# Patient Record
Sex: Female | Born: 1965 | Race: White | Hispanic: No | Marital: Married | Smoking: Never smoker
Health system: Southern US, Community
[De-identification: ages and names within clinical notes are randomized; demographics above are authoritative.]

## PROBLEM LIST (undated history)

## (undated) ENCOUNTER — Emergency Department (HOSPITAL_BASED_OUTPATIENT_CLINIC_OR_DEPARTMENT_OTHER): Admission: EM | Source: Home / Self Care

## (undated) DIAGNOSIS — M1990 Unspecified osteoarthritis, unspecified site: Secondary | ICD-10-CM

## (undated) DIAGNOSIS — K6289 Other specified diseases of anus and rectum: Secondary | ICD-10-CM

## (undated) DIAGNOSIS — G43909 Migraine, unspecified, not intractable, without status migrainosus: Secondary | ICD-10-CM

## (undated) HISTORY — PX: HEMORRHOID SURGERY: SHX153

## (undated) HISTORY — DX: Migraine, unspecified, not intractable, without status migrainosus: G43.909

## (undated) HISTORY — PX: NASAL RECONSTRUCTION: SHX2069

## (undated) HISTORY — DX: Other specified diseases of anus and rectum: K62.89

## (undated) HISTORY — DX: Unspecified osteoarthritis, unspecified site: M19.90

---

## 2000-03-10 ENCOUNTER — Encounter (INDEPENDENT_AMBULATORY_CARE_PROVIDER_SITE_OTHER): Payer: Self-pay

## 2000-03-10 ENCOUNTER — Ambulatory Visit (HOSPITAL_COMMUNITY): Admission: RE | Admit: 2000-03-10 | Discharge: 2000-03-10 | Payer: Self-pay | Admitting: Gastroenterology

## 2000-06-17 ENCOUNTER — Other Ambulatory Visit: Admission: RE | Admit: 2000-06-17 | Discharge: 2000-06-17 | Payer: Self-pay | Admitting: Obstetrics and Gynecology

## 2000-07-06 ENCOUNTER — Encounter (INDEPENDENT_AMBULATORY_CARE_PROVIDER_SITE_OTHER): Payer: Self-pay

## 2000-07-06 ENCOUNTER — Other Ambulatory Visit: Admission: RE | Admit: 2000-07-06 | Discharge: 2000-07-06 | Payer: Self-pay | Admitting: Obstetrics and Gynecology

## 2001-06-21 ENCOUNTER — Other Ambulatory Visit: Admission: RE | Admit: 2001-06-21 | Discharge: 2001-06-21 | Payer: Self-pay | Admitting: Obstetrics and Gynecology

## 2001-07-30 ENCOUNTER — Other Ambulatory Visit: Admission: RE | Admit: 2001-07-30 | Discharge: 2001-07-30 | Payer: Self-pay | Admitting: Gastroenterology

## 2001-07-30 ENCOUNTER — Encounter (INDEPENDENT_AMBULATORY_CARE_PROVIDER_SITE_OTHER): Payer: Self-pay | Admitting: Specialist

## 2002-12-15 ENCOUNTER — Other Ambulatory Visit: Admission: RE | Admit: 2002-12-15 | Discharge: 2002-12-15 | Payer: Self-pay | Admitting: Obstetrics and Gynecology

## 2003-05-29 ENCOUNTER — Ambulatory Visit (HOSPITAL_COMMUNITY): Admission: RE | Admit: 2003-05-29 | Discharge: 2003-05-29 | Payer: Self-pay | Admitting: Gastroenterology

## 2003-05-29 ENCOUNTER — Encounter (INDEPENDENT_AMBULATORY_CARE_PROVIDER_SITE_OTHER): Payer: Self-pay | Admitting: Specialist

## 2003-07-17 ENCOUNTER — Ambulatory Visit (HOSPITAL_COMMUNITY): Admission: RE | Admit: 2003-07-17 | Discharge: 2003-07-17 | Payer: Self-pay | Admitting: Gastroenterology

## 2003-07-17 ENCOUNTER — Encounter (INDEPENDENT_AMBULATORY_CARE_PROVIDER_SITE_OTHER): Payer: Self-pay

## 2004-02-08 ENCOUNTER — Encounter: Admission: RE | Admit: 2004-02-08 | Discharge: 2004-02-08 | Payer: Self-pay | Admitting: Otolaryngology

## 2004-11-26 ENCOUNTER — Ambulatory Visit: Payer: Self-pay | Admitting: Gastroenterology

## 2005-03-21 ENCOUNTER — Emergency Department (HOSPITAL_COMMUNITY): Admission: EM | Admit: 2005-03-21 | Discharge: 2005-03-21 | Payer: Self-pay | Admitting: Emergency Medicine

## 2005-03-26 ENCOUNTER — Ambulatory Visit (HOSPITAL_COMMUNITY): Admission: RE | Admit: 2005-03-26 | Discharge: 2005-03-26 | Payer: Self-pay | Admitting: Emergency Medicine

## 2005-05-21 ENCOUNTER — Ambulatory Visit: Payer: Self-pay | Admitting: Gastroenterology

## 2005-05-27 ENCOUNTER — Ambulatory Visit (HOSPITAL_COMMUNITY): Admission: RE | Admit: 2005-05-27 | Discharge: 2005-05-27 | Payer: Self-pay | Admitting: Gastroenterology

## 2005-06-03 ENCOUNTER — Ambulatory Visit: Payer: Self-pay | Admitting: Gastroenterology

## 2005-12-30 ENCOUNTER — Other Ambulatory Visit: Admission: RE | Admit: 2005-12-30 | Discharge: 2005-12-30 | Payer: Self-pay | Admitting: Obstetrics and Gynecology

## 2006-06-05 ENCOUNTER — Emergency Department (HOSPITAL_COMMUNITY): Admission: EM | Admit: 2006-06-05 | Discharge: 2006-06-05 | Payer: Self-pay | Admitting: Emergency Medicine

## 2007-09-06 ENCOUNTER — Encounter: Admission: RE | Admit: 2007-09-06 | Discharge: 2007-09-06 | Payer: Self-pay | Admitting: Gynecology

## 2008-05-25 ENCOUNTER — Other Ambulatory Visit: Admission: RE | Admit: 2008-05-25 | Discharge: 2008-05-25 | Payer: Self-pay | Admitting: Gynecology

## 2008-08-30 ENCOUNTER — Ambulatory Visit (HOSPITAL_BASED_OUTPATIENT_CLINIC_OR_DEPARTMENT_OTHER): Admission: RE | Admit: 2008-08-30 | Discharge: 2008-08-30 | Payer: Self-pay | Admitting: Urology

## 2008-08-30 ENCOUNTER — Encounter (INDEPENDENT_AMBULATORY_CARE_PROVIDER_SITE_OTHER): Payer: Self-pay | Admitting: Urology

## 2008-12-14 ENCOUNTER — Ambulatory Visit: Payer: Self-pay | Admitting: Women's Health

## 2009-04-19 ENCOUNTER — Ambulatory Visit: Payer: Self-pay | Admitting: Women's Health

## 2009-07-03 ENCOUNTER — Other Ambulatory Visit: Admission: RE | Admit: 2009-07-03 | Discharge: 2009-07-03 | Payer: Self-pay | Admitting: Gynecology

## 2009-07-03 ENCOUNTER — Encounter: Payer: Self-pay | Admitting: Gynecology

## 2009-07-03 ENCOUNTER — Ambulatory Visit: Payer: Self-pay | Admitting: Gynecology

## 2009-07-23 ENCOUNTER — Ambulatory Visit: Payer: Self-pay | Admitting: Gynecology

## 2009-11-07 HISTORY — PX: ABDOMINAL HYSTERECTOMY: SHX81

## 2009-11-12 ENCOUNTER — Ambulatory Visit (HOSPITAL_COMMUNITY): Admission: RE | Admit: 2009-11-12 | Discharge: 2009-11-13 | Payer: Self-pay | Admitting: Obstetrics and Gynecology

## 2009-12-28 ENCOUNTER — Emergency Department (HOSPITAL_BASED_OUTPATIENT_CLINIC_OR_DEPARTMENT_OTHER): Admission: EM | Admit: 2009-12-28 | Discharge: 2009-12-28 | Payer: Self-pay | Admitting: Emergency Medicine

## 2010-03-14 ENCOUNTER — Other Ambulatory Visit: Admission: RE | Admit: 2010-03-14 | Discharge: 2010-03-14 | Payer: Self-pay | Admitting: Radiology

## 2010-12-29 ENCOUNTER — Encounter: Payer: Self-pay | Admitting: Gastroenterology

## 2011-02-17 ENCOUNTER — Ambulatory Visit: Payer: PRIVATE HEALTH INSURANCE | Attending: Sports Medicine

## 2011-02-17 DIAGNOSIS — M542 Cervicalgia: Secondary | ICD-10-CM | POA: Insufficient documentation

## 2011-02-17 DIAGNOSIS — M2569 Stiffness of other specified joint, not elsewhere classified: Secondary | ICD-10-CM | POA: Insufficient documentation

## 2011-02-17 DIAGNOSIS — IMO0001 Reserved for inherently not codable concepts without codable children: Secondary | ICD-10-CM | POA: Insufficient documentation

## 2011-02-21 ENCOUNTER — Ambulatory Visit: Payer: PRIVATE HEALTH INSURANCE

## 2011-02-24 ENCOUNTER — Ambulatory Visit: Payer: PRIVATE HEALTH INSURANCE | Admitting: Physical Therapy

## 2011-02-26 ENCOUNTER — Ambulatory Visit: Payer: PRIVATE HEALTH INSURANCE

## 2011-03-03 ENCOUNTER — Ambulatory Visit: Payer: PRIVATE HEALTH INSURANCE

## 2011-03-06 ENCOUNTER — Ambulatory Visit: Payer: PRIVATE HEALTH INSURANCE

## 2011-03-10 ENCOUNTER — Ambulatory Visit: Payer: PRIVATE HEALTH INSURANCE | Attending: Sports Medicine

## 2011-03-10 DIAGNOSIS — IMO0001 Reserved for inherently not codable concepts without codable children: Secondary | ICD-10-CM | POA: Insufficient documentation

## 2011-03-10 DIAGNOSIS — M2569 Stiffness of other specified joint, not elsewhere classified: Secondary | ICD-10-CM | POA: Insufficient documentation

## 2011-03-10 DIAGNOSIS — M542 Cervicalgia: Secondary | ICD-10-CM | POA: Insufficient documentation

## 2011-03-11 LAB — CBC
HCT: 30.3 % — ABNORMAL LOW (ref 36.0–46.0)
HCT: 40.6 % (ref 36.0–46.0)
Hemoglobin: 10.3 g/dL — ABNORMAL LOW (ref 12.0–15.0)
Hemoglobin: 13.6 g/dL (ref 12.0–15.0)
MCHC: 33.4 g/dL (ref 30.0–36.0)
MCHC: 34 g/dL (ref 30.0–36.0)
MCV: 92.8 fL (ref 78.0–100.0)
MCV: 93.4 fL (ref 78.0–100.0)
Platelets: 202 10*3/uL (ref 150–400)
Platelets: 246 10*3/uL (ref 150–400)
RBC: 3.25 MIL/uL — ABNORMAL LOW (ref 3.87–5.11)
RBC: 4.37 MIL/uL (ref 3.87–5.11)
RDW: 13.6 % (ref 11.5–15.5)
RDW: 13.8 % (ref 11.5–15.5)
WBC: 13.5 10*3/uL — ABNORMAL HIGH (ref 4.0–10.5)
WBC: 7.7 10*3/uL (ref 4.0–10.5)

## 2011-03-19 ENCOUNTER — Ambulatory Visit: Payer: PRIVATE HEALTH INSURANCE

## 2011-04-03 ENCOUNTER — Encounter: Payer: PRIVATE HEALTH INSURANCE | Admitting: Physical Therapy

## 2011-04-22 NOTE — Op Note (Signed)
Megan Castillo, Megan Castillo                ACCOUNT NO.:  [id number]   MEDICAL RECORD NO.:  [id number]          PATIENT TYPE:  AMB   LOCATION:  [facility]                         FACILITY:  [facility]   PHYSICIAN:  Maretta Bees. Vonita Moss, M.D.DATE OF BIRTH:  1966-05-02   DATE OF PROCEDURE:  08/30/2008  DATE OF DISCHARGE:                               OPERATIVE REPORT   PREOPERATIVE DIAGNOSIS:  Rule out interstitial cystitis.   POSTOPERATIVE DIAGNOSIS:  Rule out interstitial cystitis.   PROCEDURE:  Cystoscopy, hydraulic overdistention of bladder and cold cup  bladder biopsy.   SURGEON:  Maretta Bees. Vonita Moss, M.D.   ANESTHESIA:  General.   INDICATIONS:  This lady has had problems of frequent urination and  nocturia and bladder pressure/pain over the last several weeks.  She had  a pelvic pain and symptom score of 21 which was concerning for the  diagnosis of interstitial cystitis.  She said she stopped drinking tea  which was her only drink for the last week and is feeling somewhat  better but still has frequency 10-12 times a day and nocturia times one  to two.   PROCEDURE IN DETAIL:  The patient was brought to the operating room and  placed in lithotomy position.  External genitalia were prepped and  draped in the usual fashion.  She was cystoscoped and the bladder was  unremarkable with no stones, tumors or inflammatory lesions.  She then  underwent bladder dilation of 36 inches of water pressure to 700 mL at  which time she was leaking around the cystoscope.  I emptied the bladder  and looked back in and there was gross hemorrhage and submucosal  petechiae in all four quadrants of the bladder.  There was no evidence  of any bladder rupture.  At this point I used the cold cup bladder  biopsy forceps and took 3 biopsies in hemorrhage areas across the  bladder base and fulgurated the biopsy sites with the Bugbee electrode.  At this point there was good hemostasis and the bladder was emptied, the  scope  was removed.  She was given a B&O suppository and also 30 mg of  Toradol IV and was taken to the recovery room in good condition having  tolerated the procedure well.      Maretta Bees. Vonita Moss, M.D.  Electronically Signed     LJP/MEDQ  D:  08/30/2008  T:  08/30/2008  Job:  [id number]   cc:   Gaetano Hawthorne. Lily Peer, M.D.  Fax: [phone]

## 2011-09-08 LAB — POCT HEMOGLOBIN-HEMACUE: Hemoglobin: 13.2

## 2011-09-30 ENCOUNTER — Other Ambulatory Visit: Payer: Self-pay | Admitting: Gastroenterology

## 2011-10-01 ENCOUNTER — Ambulatory Visit
Admission: RE | Admit: 2011-10-01 | Discharge: 2011-10-01 | Disposition: A | Discharge status: Home / Self Care | Payer: PRIVATE HEALTH INSURANCE | Source: Ambulatory Visit | Attending: Gastroenterology | Admitting: Gastroenterology

## 2011-10-01 MED ORDER — IOHEXOL 300 MG/ML  SOLN
100.0000 mL | Freq: Once | INTRAMUSCULAR | Status: AC | PRN
  Administered 2011-10-01: 100 mL via INTRAVENOUS

## 2011-11-10 ENCOUNTER — Encounter (INDEPENDENT_AMBULATORY_CARE_PROVIDER_SITE_OTHER): Payer: Self-pay | Admitting: General Surgery

## 2011-11-10 ENCOUNTER — Ambulatory Visit (INDEPENDENT_AMBULATORY_CARE_PROVIDER_SITE_OTHER): Payer: PRIVATE HEALTH INSURANCE | Admitting: General Surgery

## 2011-11-10 VITALS — BP 126/88 | HR 66 | Temp 98.4°F | Resp 16 | Ht 64.0 in | Wt 143.0 lb

## 2011-11-10 DIAGNOSIS — K645 Perianal venous thrombosis: Secondary | ICD-10-CM

## 2011-11-10 NOTE — Progress Notes (Signed)
Subjective:     Patient ID: Megan Castillo, female   DOB: [birth year], 45 y.o.   MRN: [id number]  HPIMs. Megan Castillo is seen only urgent office as a thrombosed external hemorrhoid Dr. Catha Gosselin is her regular physician but she's not seen him recently. The patient states about 2 years ago she thrombosed hemorrhoid was seen by one of our port and is here in the office hemorrhoid was M.D. Patient states that 3 weeks ago she had a colonoscopy performed by Dr. Charna Elizabeth and then over the Thanksgiving weekend when on a cruise and didn't have a bowel movement for 5 days and then started having problems with this thrombosed external hemorrhoid   Review of Systems No current outpatient prescriptions on file.   Past Surgical History  Procedure Date  . Abdominal hysterectomy 11/2009   No Known Allergies      Objective:   Physical ExamBP 126/88  Pulse 66  Temp(Src) 98.4 F (36.9 C) (Temporal)  Resp 16  Ht 5\' 4"  (1.626 m)  Wt 143 lb (64.864 kg)  BMI 24.55 kg/m2 Patient has a thrombosed hemorrhoid left posterior quadrant with a lot of anal spasm and use topical lidocaine ointment and then try and anesthetize it she is difficult to get complete anesthetic after about 5 or 6 cc of 1% with adrenaline I was able anesthetized and excised the external hemorrhoid with thrombosed and put a figure-of-eight suture of 3-0 chromic at the apex for hemostasis     Assessment:     Thrombosed external hemorrhoid history of previous thrombosed femoral. Anoscopic exam doesn't show a ternal hemorrhoids at this time and Dr. Loreta Ave did not note any significant hemorrhoids to the patient when she did recent colonoscopy    Plan:     Return prn Vicodin & 5% lidocain ointment

## 2011-11-10 NOTE — Patient Instructions (Signed)
MiraLax or other stool softeners laxatives on daily basis for the next few days. Apply lidocaine 5% ointment to the anus on p.r.n. basis and return to see [place] in about 2-3 weeks if her still having some problem. Soaking in warm tub will help of the spasm in keeping the area clean will help with rapid

## 2011-11-12 ENCOUNTER — Telehealth (INDEPENDENT_AMBULATORY_CARE_PROVIDER_SITE_OTHER): Payer: Self-pay

## 2011-11-12 NOTE — Telephone Encounter (Signed)
Pt called c/o constipation. Pt had thromb hems done Monday. Pt advised to use miralax,  and stool softners, increase fluids. If not resolving pt can use glycerin suppository. Pt to call if this does not resolve constipation.

## 2011-11-14 ENCOUNTER — Telehealth (INDEPENDENT_AMBULATORY_CARE_PROVIDER_SITE_OTHER): Payer: Self-pay

## 2011-11-14 NOTE — Telephone Encounter (Signed)
Pt calling in b/c still concerned about having some bleeding after having a throm hem excised by Dr Zachery Dakins on Monday. I explained to the pt that this was not uncommon after having this excised b/c she has stitches from the procedure. I advised pt that the bleeding still could occur up till next week b/c everytime she has a BM it will aggravate the area that was excised. I advised the pt to take Advil around the clock, use baby wet wipes to clean after BM, and to do warm sitz baths. The pt understands and will call us back if things get worse./ [facility]

## 2011-12-22 ENCOUNTER — Ambulatory Visit (INDEPENDENT_AMBULATORY_CARE_PROVIDER_SITE_OTHER): Payer: Private Health Insurance - Indemnity | Admitting: General Surgery

## 2011-12-22 ENCOUNTER — Encounter (INDEPENDENT_AMBULATORY_CARE_PROVIDER_SITE_OTHER): Payer: Self-pay | Admitting: General Surgery

## 2011-12-22 VITALS — BP 120/65 | HR 72 | Temp 98.1°F | Resp 16 | Ht 65.0 in | Wt 148.0 lb

## 2011-12-22 DIAGNOSIS — K648 Other hemorrhoids: Secondary | ICD-10-CM

## 2011-12-22 MED ORDER — PRAMOXINE HCL 1 % RE FOAM: Freq: Three times a day (TID) | RECTAL | Status: AC

## 2011-12-22 NOTE — Progress Notes (Signed)
Chief Complaint  Patient presents with  . Follow-up    HISTORY: Pt presents with hemorrhoids on and off for last 18 months.  She had external thrombosed hemorrhoid excised by Dr. Zachery Dakins in early December.  She has not had recurrent pain, but she has started to have bleeding.  She did get constipated for a bit in the last few days.  She noted the blood 2 days ago.  She denies nausea or vomiting.    Past Medical History  Diagnosis Date  . Arthritis   . Rectal pain   . Constipation   . Hemorrhoids     Past Surgical History  Procedure Date  . Abdominal hysterectomy 11/2009  . Hemorrhoid surgery     Current Outpatient Prescriptions  Medication Sig Dispense Refill  . pramoxine (PROCTOFOAM) 1 % foam Place rectally 3 (three) times daily.  15 g  3     No Known Allergies   No family history on file.   History   Social History  . Marital Status: Married    Spouse Name: N/A    Number of Children: N/A  . Years of Education: N/A   Social History Main Topics  . Smoking status: Never Smoker   . Smokeless tobacco: Never Used  . Alcohol Use: No  . Drug Use: No  . Sexually Active: None   REVIEW OF SYSTEMS - PERTINENT POSITIVES ONLY: 12 point review of systems negative other than HPI and PMH.  EXAM: Filed Vitals:   12/22/11 1449  BP: 120/65  Pulse: 72  Temp: 98.1 F (36.7 C)  Resp: 16    Gen:  No acute distress.  Well nourished and well groomed.   Neurological: Alert and oriented to person, place, and time. Coordination normal.  Head: Normocephalic and atraumatic.  Eyes: Conjunctivae are normal. Pupils are equal, round, and reactive to light. No scleral icterus.  Cardiovascular: Normal rate. Respiratory: Effort normal.  No respiratory distress.  Rectal:  No external hemorrhoids.  Circumferential internal hemorrhoids are injected.  Posterior one largest.   Skin: Skin is warm and dry. No rash noted. No diaphoresis. No erythema. No pallor. No clubbing, cyanosis, or  edema.   Psychiatric: Normal mood and affect. Behavior is normal. Judgment and thought content normal.      ASSESSMENT AND PLAN: Hemorrhoids, internal, with bleeding Injected hemorrhoids circumferentially. Posterior one worst. Advised on good bowel habits. Follow up in 6-8 weeks.        Maudry Diego MD Surgical Oncology, General and Endocrine Surgery [facility]      Visit Diagnoses: 1. Hemorrhoids, internal, with bleeding     Primary Care Physician: Mickie Hillier, MD, MD

## 2011-12-22 NOTE — Patient Instructions (Signed)
Hemorrhoids  Hemorrhoids are enlarged (dilated) veins around the rectum. There are 2 types of hemorrhoids, and the type of hemorrhoid is determined by its location. Internal hemorrhoids occur in the veins just inside the rectum.They are usually not painful, but they may bleed.However, they may poke through to the outside and become irritated and painful. External hemorrhoids involve the veins outside the anus and can be felt as a painful swelling or hard lump near the anus.They are often itchy and may crack and bleed. Sometimes clots will form in the veins. This makes them swollen and painful. These are called thrombosed hemorrhoids. CAUSES Causes of hemorrhoids include:  Pregnancy. This increases the pressure in the hemorrhoidal veins.   Constipation.   Straining to have a bowel movement.   Obesity.   Heavy lifting or other activity that caused you to strain.   TREATMENT Most of the time hemorrhoids improve in 1 to 2 weeks. However, if symptoms do not seem to be getting better or if you have a lot of rectal bleeding, your caregiver may perform a procedure to help make the hemorrhoids get smaller or remove them completely.Possible treatments include:  Rubber band ligation. A rubber band is placed at the base of the hemorrhoid to cut off the circulation.   Sclerotherapy. A chemical is injected to shrink the hemorrhoid.   Hemorrhoidectomy. This is surgical removal of the hemorrhoid.   HOME CARE INSTRUCTIONS   Increase fiber in your diet. Ask your caregiver about using fiber supplements.   Drink enough water and fluids to keep your urine clear or pale yellow.   Exercise regularly.   Go to the bathroom when you have the urge to have a bowel movement. Do not wait.   Avoid straining to have bowel movements.   Keep the anal area dry and clean.   Take prescription hemorrhoid treatment.    Only take over-the-counter or prescription medicines for pain, discomfort, or fever as  directed by your caregiver.  1. Colace 100 mg twice a day 2. Dulcolax laxative or miralax if needed.    Take warm sitz baths for 20 to 30 minutes, 3 to 4 times per day while hemorrhoids are inflamed.    If the hemorrhoids are very tender and swollen, place ice packs on the area as tolerated. Using ice packs between sitz baths may be helpful. Fill a plastic bag with ice. Place a towel between the bag of ice and your skin.   Medicated creams and suppositories may be used or applied as directed.   Do not use a donut-shaped pillow or sit on the toilet for long periods. This increases blood pooling and pain.   SEEK MEDICAL CARE IF:   You have increasing pain and swelling that is not controlled with your medicine.   You have uncontrolled bleeding.   You have difficulty or you are unable to have a bowel movement.   You have pain or inflammation outside the area of the hemorrhoids.   You have chills or an oral temperature above 102 F (38.9 C).

## 2011-12-22 NOTE — Assessment & Plan Note (Signed)
Injected hemorrhoids circumferentially. Posterior one worst. Advised on good bowel habits. Follow up in 6-8 weeks.

## 2012-02-09 ENCOUNTER — Encounter (INDEPENDENT_AMBULATORY_CARE_PROVIDER_SITE_OTHER): Payer: Private Health Insurance - Indemnity | Admitting: General Surgery

## 2012-02-11 ENCOUNTER — Other Ambulatory Visit: Payer: Self-pay | Admitting: Obstetrics and Gynecology

## 2012-03-22 ENCOUNTER — Encounter (INDEPENDENT_AMBULATORY_CARE_PROVIDER_SITE_OTHER): Payer: Private Health Insurance - Indemnity | Admitting: General Surgery

## 2012-08-12 ENCOUNTER — Other Ambulatory Visit: Payer: Self-pay | Admitting: Family Medicine

## 2012-08-12 DIAGNOSIS — J329 Chronic sinusitis, unspecified: Secondary | ICD-10-CM

## 2012-08-13 ENCOUNTER — Ambulatory Visit
Admission: RE | Admit: 2012-08-13 | Discharge: 2012-08-13 | Disposition: A | Discharge status: Home / Self Care | Payer: 59 | Source: Ambulatory Visit | Attending: Family Medicine | Admitting: Family Medicine

## 2012-08-13 DIAGNOSIS — J329 Chronic sinusitis, unspecified: Secondary | ICD-10-CM

## 2012-09-15 ENCOUNTER — Other Ambulatory Visit: Payer: Self-pay | Admitting: Obstetrics and Gynecology

## 2012-09-15 DIAGNOSIS — N644 Mastodynia: Secondary | ICD-10-CM

## 2012-09-16 ENCOUNTER — Other Ambulatory Visit: Payer: Self-pay | Admitting: Obstetrics and Gynecology

## 2012-09-16 ENCOUNTER — Ambulatory Visit
Admission: RE | Admit: 2012-09-16 | Discharge: 2012-09-16 | Disposition: A | Discharge status: Home / Self Care | Payer: Managed Care, Other (non HMO) | Source: Ambulatory Visit | Attending: Obstetrics and Gynecology | Admitting: Obstetrics and Gynecology

## 2012-09-16 DIAGNOSIS — N644 Mastodynia: Secondary | ICD-10-CM

## 2013-02-23 ENCOUNTER — Encounter: Payer: Self-pay | Admitting: Diagnostic Neuroimaging

## 2013-03-08 ENCOUNTER — Ambulatory Visit (INDEPENDENT_AMBULATORY_CARE_PROVIDER_SITE_OTHER): Payer: Managed Care, Other (non HMO)

## 2013-03-08 DIAGNOSIS — R209 Unspecified disturbances of skin sensation: Secondary | ICD-10-CM

## 2013-03-08 NOTE — Procedures (Signed)
   [facility]  NCS (NERVE CONDUCTION STUDY) WITH EMG (ELECTROMYOGRAPHY) REPORT   STUDY DATE: 03/08/13 PATIENT NAME: CAYDENCE KOENIG DOB: 02/05/1966 MRN: [id number]  ORDERING CLINICIAN: Joycelyn Schmid, MD   TECHNOLOGIST: Gearldine Shown ELECTROMYOGRAPHER: Glenford Bayley. Phyillis Dascoli, MD  CLINICAL INFORMATION: 47 year old female with numbness (arms and legs).  FINDINGS: NERVE CONDUCTION STUDY: Left median, left ulnar, bilateral peroneal and bilateral tibial motor responses have normal distal latencies, amplitudes, conduction velocities and F-wave latencies. Bilateral superficial peroneal sensory responses are normal.  NEEDLE ELECTROMYOGRAPHY: Needle examination of selected muscles left upper surgery (deltoid, biceps, triceps, flexor carpi radialis, first dorsal interosseous)shows no abnormal spontaneous activity at rest and normal motor unit recruitment on exertion.  IMPRESSION:  This is a normal study. No electrodiagnostic evidence of large fiber neuropathy at this time.   INTERPRETING PHYSICIAN:  Suanne Marker, MD Certified in Neurology, Neurophysiology and Neuroimaging  [facility] [address] [place], [place] [address] [phone]

## 2013-04-10 ENCOUNTER — Encounter: Payer: Self-pay | Admitting: Family Medicine

## 2013-04-10 ENCOUNTER — Ambulatory Visit (INDEPENDENT_AMBULATORY_CARE_PROVIDER_SITE_OTHER): Payer: Managed Care, Other (non HMO) | Admitting: Family Medicine

## 2013-04-10 VITALS — BP 110/88 | HR 82 | Temp 98.1°F | Resp 16 | Ht 63.0 in | Wt 148.0 lb

## 2013-04-10 DIAGNOSIS — R071 Chest pain on breathing: Secondary | ICD-10-CM

## 2013-04-10 DIAGNOSIS — G4700 Insomnia, unspecified: Secondary | ICD-10-CM

## 2013-04-10 DIAGNOSIS — R109 Unspecified abdominal pain: Secondary | ICD-10-CM

## 2013-04-10 DIAGNOSIS — F4323 Adjustment disorder with mixed anxiety and depressed mood: Secondary | ICD-10-CM

## 2013-04-10 DIAGNOSIS — E785 Hyperlipidemia, unspecified: Secondary | ICD-10-CM

## 2013-04-10 DIAGNOSIS — R3129 Other microscopic hematuria: Secondary | ICD-10-CM

## 2013-04-10 LAB — POCT CBC
Granulocyte percent: 62.5 %G (ref 37–80)
HCT, POC: 43.9 % (ref 37.7–47.9)
Hemoglobin: 13.9 g/dL (ref 12.2–16.2)
Lymph, poc: 2.8 (ref 0.6–3.4)
MCH, POC: 29.6 pg (ref 27–31.2)
MCHC: 31.7 g/dL — AB (ref 31.8–35.4)
MCV: 93.5 fL (ref 80–97)
MID (cbc): 0.5 (ref 0–0.9)
MPV: 8.9 fL (ref 0–99.8)
POC Granulocyte: 5.6 (ref 2–6.9)
POC LYMPH PERCENT: 31.7 %L (ref 10–50)
POC MID %: 5.8 %M (ref 0–12)
Platelet Count, POC: 291 10*3/uL (ref 142–424)
RBC: 4.69 M/uL (ref 4.04–5.48)
RDW, POC: 14.2 %
WBC: 8.9 10*3/uL (ref 4.6–10.2)

## 2013-04-10 LAB — POCT URINALYSIS DIPSTICK
Bilirubin, UA: NEGATIVE
Glucose, UA: NEGATIVE
Ketones, UA: NEGATIVE
Leukocytes, UA: NEGATIVE
Nitrite, UA: NEGATIVE
Protein, UA: 30
Spec Grav, UA: 1.025
Urobilinogen, UA: 0.2
pH, UA: 6

## 2013-04-10 LAB — POCT UA - MICROSCOPIC ONLY
Casts, Ur, LPF, POC: NEGATIVE
Crystals, Ur, HPF, POC: NEGATIVE
Mucus, UA: POSITIVE
WBC, Ur, HPF, POC: NEGATIVE
Yeast, UA: NEGATIVE

## 2013-04-10 MED ORDER — ALPRAZOLAM 0.25 MG PO TABS: 0.2500 mg | ORAL_TABLET | Freq: Three times a day (TID) | ORAL | Status: DC | PRN

## 2013-04-10 NOTE — Patient Instructions (Addendum)
Xanax if needed for sleep or during times of more anxiety or stress.  See the numbers below for some possible therapists/couselors if you would like.   Alan Ripper Huprich: [phone] Karmen Bongo [phone] Verlan Friends: [phone]  Plan on recheck cholesterol in next 3-6 months.   Your should receive a call or letter about your lab results within the next week, but likely sooner. Try over the counter zantac and improvements in diet, and if abdominal pain not improving next few days.  Return to the clinic or go to the nearest emergency room if any of your symptoms worsen or new symptoms occur.  Plan to recheck urine test in next 1-2 months.

## 2013-04-10 NOTE — Progress Notes (Signed)
Subjective:    Patient ID: Megan Castillo, female    DOB: 01-11-1966, 47 y.o.   MRN: [id number]  HPI Megan Castillo is a 47 y.o. female PCP: prior seen by at [facility] - Dr. Corliss Blacker last treated patient.  Here for few concerns.   Had labs for life insurance company - drawn at lab on 04/04/13 - borderline total protein at 6.4, but normal at 7.2 on October 2013. Normal labs except LDL 122,  HDL 59.  red yeast rice last year form Dr. Henderson Cloud - LDL 153 then.   Low B12 - at [facility] - unknown level - but planned on recheck in 3 months.   Abdominal pain - past 2-3 days. Feels bloated, but eating more fast foods. No fever. Nausea last night, no vomiting. No diarrhea. No blood in stool, no dark or tarry stools. No difficulty urinating. No dysuria/urgency - no hematuria. Last BM - last night - normal.  Hx of hemorrhoids in past, last colonoscopy 2 years ago - plan on repeat in 5 years.   Brother has Burkitt's lymphoma - he is in [place]. He is 49yo. In public hospital, not on chemo, trouble getting him home.  This is stressful.  29 yo brother died of head injury last year.  Plans on flying to [place].  Has been "down in the dumps".  Difficulty with sleep - no meds.   Review of Systems  Constitutional: Negative for fever and chills.  Respiratory: Negative for shortness of breath.   Gastrointestinal: Positive for nausea and abdominal pain. Negative for vomiting, diarrhea, blood in stool, abdominal distention and anal bleeding.  Psychiatric/Behavioral: Positive for sleep disturbance. Negative for suicidal ideas and dysphoric mood.       Stress with current family issues, difficulty sleeping at night.   All other systems reviewed and are negative.       Objective:   Physical Exam  Vitals reviewed. Constitutional: She is oriented to person, place, and time. She appears well-developed and well-nourished.  HENT:  Head: Normocephalic and atraumatic.  Pulmonary/Chest: Effort normal and  breath sounds normal.  Abdominal: Soft. Normal appearance. She exhibits no distension. There is no CVA tenderness.  Neurological: She is alert and oriented to person, place, and time.  Skin: Skin is warm.  Psychiatric: She has a normal mood and affect. Her speech is normal and behavior is normal. Judgment normal. Cognition and memory are normal. She expresses no suicidal ideation.  Few tearful episodes during discussion, but good eye contact, appropriate in responses.    Results for orders placed in visit on 04/10/13  POCT CBC      Result Value Range   WBC 8.9  4.6 - 10.2 K/uL   Lymph, poc 2.8  0.6 - 3.4   POC LYMPH PERCENT 31.7  10 - 50 %L   MID (cbc) 0.5  0 - 0.9   POC MID % 5.8  0 - 12 %M   POC Granulocyte 5.6  2 - 6.9   Granulocyte percent 62.5  37 - 80 %G   RBC 4.69  4.04 - 5.48 M/uL   Hemoglobin 13.9  12.2 - 16.2 g/dL   HCT, POC 40.9  81.1 - 47.9 %   MCV 93.5  80 - 97 fL   MCH, POC 29.6  27 - 31.2 pg   MCHC 31.7 (*) 31.8 - 35.4 g/dL   RDW, POC 91.4     Platelet Count, POC 291  142 - 424 K/uL  MPV 8.9  0 - 99.8 fL  POCT URINALYSIS DIPSTICK      Result Value Range   Color, UA yellow     Clarity, UA clear     Glucose, UA neg     Bilirubin, UA neg     Ketones, UA neg     Spec Grav, UA 1.025     Blood, UA trace     pH, UA 6.0     Protein, UA 30     Urobilinogen, UA 0.2     Nitrite, UA neg     Leukocytes, UA Negative    POCT UA - MICROSCOPIC ONLY      Result Value Range   WBC, Ur, HPF, POC neg     RBC, urine, microscopic 1-2     Bacteria, U Microscopic trace     Mucus, UA positive     Epithelial cells, urine per micros 1-4     Crystals, Ur, HPF, POC neg     Casts, Ur, LPF, POC neg     Yeast, UA neg     Hysterectomy in 2011.     Assessment & Plan:  Megan Castillo is a 47 y.o. female  Abdominal pain, unspecified site - Plan: POCT CBC, Comprehensive metabolic panel, POCT urinalysis dipstick, POCT UA - Microscopic Only, Lipase  Other and unspecified  hyperlipidemia  Adjustment disorder with mixed anxiety and depressed mood - Plan: ALPRAZolam (XANAX) 0.25 MG tablet  Insomnia - Plan: ALPRAZolam (XANAX) 0.25 MG tablet  Hematuria, microscopic - Plan: Urine culture    Hyperlipidemia - borderline - ok to continue red yeast rice.  Recheck in 3-6 months. Outside labs reviewed and discussed overall appear normal, even with borderline low total protein at 6.4. At her request will recheck CMP today, but reassuring labs overall at present.   Abd pain - generalized, suspect diet component with bloating, and current family stressors - may be anxiety component.   Adjustment d/o with anxiety, insomnia -  Phone numbers given for counseling if she would like, and will rx short course of xanax if needed. Recheck next 1 month.   Trace hematuria - may have had in past per discussion.  Recheck at next ov. Check urine cx, but unlikey infection as asymptomatic.   Meds ordered this encounter  Medications  . ALPRAZolam (XANAX) 0.25 MG tablet    Sig: Take 1 tablet (0.25 mg total) by mouth 3 (three) times daily as needed for sleep or anxiety.    Dispense:  20 tablet    Refill:  0   Patient Instructions  Xanax if needed for sleep or during times of more anxiety or stress.  See the numbers below for some possible therapists/couselors if you would like.   Alan Ripper Huprich: [phone] Karmen Bongo [phone] Verlan Friends: [phone]  Plan on recheck cholesterol in next 3-6 months.   Your should receive a call or letter about your lab results within the next week, but likely sooner. Try over the counter zantac and improvements in diet, and if abdominal pain not improving next few days.  Return to the clinic or go to the nearest emergency room if any of your symptoms worsen or new symptoms occur.  Plan to recheck urine test in next 1-2 months.

## 2013-04-11 LAB — LIPASE: Lipase: 22 U/L (ref 0–75)

## 2013-04-11 LAB — COMPREHENSIVE METABOLIC PANEL
ALT: 28 U/L (ref 0–35)
AST: 28 U/L (ref 0–37)
Albumin: 4.5 g/dL (ref 3.5–5.2)
Alkaline Phosphatase: 80 U/L (ref 39–117)
BUN: 14 mg/dL (ref 6–23)
CO2: 27 mEq/L (ref 19–32)
Calcium: 9.6 mg/dL (ref 8.4–10.5)
Chloride: 102 mEq/L (ref 96–112)
Creat: 0.7 mg/dL (ref 0.50–1.10)
Glucose, Bld: 83 mg/dL (ref 70–99)
Potassium: 3.8 mEq/L (ref 3.5–5.3)
Sodium: 138 mEq/L (ref 135–145)
Total Bilirubin: 0.3 mg/dL (ref 0.3–1.2)
Total Protein: 7.4 g/dL (ref 6.0–8.3)

## 2013-04-12 ENCOUNTER — Telehealth: Payer: Self-pay

## 2013-04-12 LAB — URINE CULTURE
Colony Count: NO GROWTH
Organism ID, Bacteria: NO GROWTH

## 2013-04-12 NOTE — Telephone Encounter (Signed)
PATIENT IS CALLING TO FIND OUT LAB RESULTS. [phone]

## 2013-04-12 NOTE — Telephone Encounter (Signed)
Please advise on labs.

## 2013-04-13 NOTE — Telephone Encounter (Signed)
See lab notes

## 2013-04-13 NOTE — Telephone Encounter (Signed)
Notes Recorded by Shade Flood, MD on 04/13/2013 at 1:56 PM Call pt - urine culture did not show infection. Kidney and liver tests normal. Pancreas test normal. Follow up if not improving.   Called her to advise. She was thankful for the care she got here and appreciates our help/ Dr Neva Seat was advised

## 2013-05-18 ENCOUNTER — Emergency Department (HOSPITAL_BASED_OUTPATIENT_CLINIC_OR_DEPARTMENT_OTHER)
Admission: EM | Admit: 2013-05-18 | Discharge: 2013-05-18 | Disposition: A | Discharge status: Other Acute Inpatient Hospital | Payer: Managed Care, Other (non HMO)

## 2013-05-27 ENCOUNTER — Other Ambulatory Visit: Payer: Self-pay | Admitting: Gastroenterology

## 2013-05-27 DIAGNOSIS — M898X1 Other specified disorders of bone, shoulder: Secondary | ICD-10-CM

## 2013-05-30 ENCOUNTER — Other Ambulatory Visit: Payer: Self-pay | Admitting: Gastroenterology

## 2013-05-30 DIAGNOSIS — R110 Nausea: Secondary | ICD-10-CM

## 2013-06-02 ENCOUNTER — Ambulatory Visit
Admission: RE | Admit: 2013-06-02 | Discharge: 2013-06-02 | Disposition: A | Discharge status: Home / Self Care | Payer: Managed Care, Other (non HMO) | Source: Ambulatory Visit | Attending: Gastroenterology | Admitting: Gastroenterology

## 2013-06-02 DIAGNOSIS — R110 Nausea: Secondary | ICD-10-CM

## 2013-06-02 MED ORDER — IOHEXOL 300 MG/ML  SOLN
100.0000 mL | Freq: Once | INTRAMUSCULAR | Status: AC | PRN
  Administered 2013-06-02: 100 mL via INTRAVENOUS

## 2013-10-17 ENCOUNTER — Other Ambulatory Visit: Payer: Self-pay | Admitting: Obstetrics and Gynecology

## 2013-10-17 DIAGNOSIS — N644 Mastodynia: Secondary | ICD-10-CM

## 2013-11-01 ENCOUNTER — Other Ambulatory Visit: Payer: Self-pay | Admitting: Obstetrics and Gynecology

## 2013-11-01 ENCOUNTER — Ambulatory Visit
Admission: RE | Admit: 2013-11-01 | Discharge: 2013-11-01 | Disposition: A | Discharge status: Home / Self Care | Payer: Managed Care, Other (non HMO) | Source: Ambulatory Visit | Attending: Obstetrics and Gynecology | Admitting: Obstetrics and Gynecology

## 2013-11-01 DIAGNOSIS — N644 Mastodynia: Secondary | ICD-10-CM

## 2014-01-17 ENCOUNTER — Other Ambulatory Visit: Payer: Self-pay | Admitting: Gastroenterology

## 2014-05-22 ENCOUNTER — Other Ambulatory Visit: Payer: Self-pay | Admitting: Family Medicine

## 2014-05-22 DIAGNOSIS — R1011 Right upper quadrant pain: Secondary | ICD-10-CM

## 2014-05-25 ENCOUNTER — Ambulatory Visit
Admission: RE | Admit: 2014-05-25 | Discharge: 2014-05-25 | Disposition: A | Discharge status: Home / Self Care | Payer: Managed Care, Other (non HMO) | Source: Ambulatory Visit | Attending: Family Medicine | Admitting: Family Medicine

## 2014-05-25 DIAGNOSIS — R1011 Right upper quadrant pain: Secondary | ICD-10-CM

## 2014-12-05 ENCOUNTER — Other Ambulatory Visit: Payer: Self-pay | Admitting: Family Medicine

## 2014-12-05 DIAGNOSIS — R109 Unspecified abdominal pain: Secondary | ICD-10-CM

## 2014-12-06 ENCOUNTER — Ambulatory Visit
Admission: RE | Admit: 2014-12-06 | Discharge: 2014-12-06 | Disposition: A | Discharge status: Home / Self Care | Payer: Managed Care, Other (non HMO) | Source: Ambulatory Visit | Attending: Family Medicine | Admitting: Family Medicine

## 2014-12-06 DIAGNOSIS — R109 Unspecified abdominal pain: Secondary | ICD-10-CM

## 2014-12-06 MED ORDER — IOHEXOL 300 MG/ML  SOLN
100.0000 mL | Freq: Once | INTRAMUSCULAR | Status: AC | PRN
  Administered 2014-12-06: 100 mL via INTRAVENOUS

## 2014-12-27 ENCOUNTER — Other Ambulatory Visit: Payer: Self-pay

## 2014-12-27 DIAGNOSIS — Z1231 Encounter for screening mammogram for malignant neoplasm of breast: Secondary | ICD-10-CM

## 2015-01-11 ENCOUNTER — Ambulatory Visit: Payer: Managed Care, Other (non HMO)

## 2015-01-12 ENCOUNTER — Ambulatory Visit
Admission: RE | Admit: 2015-01-12 | Discharge: 2015-01-12 | Disposition: A | Discharge status: Home / Self Care | Payer: 59 | Source: Ambulatory Visit

## 2015-01-12 DIAGNOSIS — Z1231 Encounter for screening mammogram for malignant neoplasm of breast: Secondary | ICD-10-CM

## 2015-02-11 ENCOUNTER — Emergency Department (HOSPITAL_COMMUNITY)
Admission: EM | Admit: 2015-02-11 | Discharge: 2015-02-12 | Disposition: A | Discharge status: Home / Self Care | Payer: Managed Care, Other (non HMO) | Attending: Emergency Medicine | Admitting: Emergency Medicine

## 2015-02-11 ENCOUNTER — Encounter (HOSPITAL_COMMUNITY): Payer: Self-pay | Admitting: *Deleted

## 2015-02-11 ENCOUNTER — Emergency Department (HOSPITAL_COMMUNITY): Payer: Managed Care, Other (non HMO)

## 2015-02-11 DIAGNOSIS — R079 Chest pain, unspecified: Secondary | ICD-10-CM | POA: Insufficient documentation

## 2015-02-11 DIAGNOSIS — Z8739 Personal history of other diseases of the musculoskeletal system and connective tissue: Secondary | ICD-10-CM | POA: Diagnosis not present

## 2015-02-11 DIAGNOSIS — Z8719 Personal history of other diseases of the digestive system: Secondary | ICD-10-CM | POA: Diagnosis not present

## 2015-02-11 LAB — CBC WITH DIFFERENTIAL/PLATELET
Basophils Absolute: 0 10*3/uL (ref 0.0–0.1)
Basophils Relative: 0 % (ref 0–1)
Eosinophils Absolute: 0.2 10*3/uL (ref 0.0–0.7)
Eosinophils Relative: 2 % (ref 0–5)
HCT: 42.8 % (ref 36.0–46.0)
Hemoglobin: 14.4 g/dL (ref 12.0–15.0)
Lymphocytes Relative: 39 % (ref 12–46)
Lymphs Abs: 3.4 10*3/uL (ref 0.7–4.0)
MCH: 30.4 pg (ref 26.0–34.0)
MCHC: 33.6 g/dL (ref 30.0–36.0)
MCV: 90.3 fL (ref 78.0–100.0)
Monocytes Absolute: 0.7 10*3/uL (ref 0.1–1.0)
Monocytes Relative: 9 % (ref 3–12)
Neutro Abs: 4.3 10*3/uL (ref 1.7–7.7)
Neutrophils Relative %: 50 % (ref 43–77)
Platelets: 230 10*3/uL (ref 150–400)
RBC: 4.74 MIL/uL (ref 3.87–5.11)
RDW: 13.7 % (ref 11.5–15.5)
WBC: 8.6 10*3/uL (ref 4.0–10.5)

## 2015-02-11 LAB — BASIC METABOLIC PANEL
Anion gap: 5 (ref 5–15)
BUN: 19 mg/dL (ref 6–23)
CO2: 32 mmol/L (ref 19–32)
Calcium: 9.3 mg/dL (ref 8.4–10.5)
Chloride: 102 mmol/L (ref 96–112)
Creatinine, Ser: 0.84 mg/dL (ref 0.50–1.10)
GFR calc Af Amer: >90 mL/min (ref >90)
GFR calc non Af Amer: 80 mL/min — ABNORMAL LOW (ref >90)
Glucose, Bld: 98 mg/dL (ref 70–99)
Potassium: 3.2 mmol/L — ABNORMAL LOW (ref 3.5–5.1)
Sodium: 139 mmol/L (ref 135–145)

## 2015-02-11 LAB — URINALYSIS, ROUTINE W REFLEX MICROSCOPIC
Bilirubin Urine: NEGATIVE
Glucose, UA: NEGATIVE mg/dL
Hgb urine dipstick: NEGATIVE
Ketones, ur: NEGATIVE mg/dL
Leukocytes, UA: NEGATIVE
Nitrite: NEGATIVE
Protein, ur: NEGATIVE mg/dL
Specific Gravity, Urine: 1.018 (ref 1.005–1.030)
Urobilinogen, UA: 0.2 mg/dL (ref 0.0–1.0)
pH: 6.5 (ref 5.0–8.0)

## 2015-02-11 NOTE — ED Notes (Signed)
The pt is c/o epigastric pain with rt arm  Radiation for one week with some dizziness .  She still has her gb and she does not thinkl she has had any problems with that.  lmp none hyst

## 2015-02-11 NOTE — ED Provider Notes (Signed)
CSN: [id number]     Arrival date & time 02/11/15  1951 History   First MD Initiated Contact with Patient 02/11/15 2021     Chief Complaint  Patient presents with  . Chest Pain   (Consider location/radiation/quality/duration/timing/severity/associated sxs/prior Treatment) Patient is a 49 y.o. female presenting with chest pain. The history is provided by the patient. No language interpreter was used.  Chest Pain Pain location:  L chest Pain quality: aching and pressure   Pain radiates to:  L arm Pain radiates to the back: no   Pain severity:  Mild Onset quality:  Gradual Duration:  1 week Timing:  Intermittent Progression:  Waxing and waning Chronicity:  New Context: at rest   Context: not breathing, no drug use, not eating, no intercourse, not lifting, no movement, not raising an arm, no stress and no trauma   Relieved by:  Rest Worsened by:  Nothing tried Ineffective treatments:  None tried Associated symptoms: no abdominal pain, no anxiety, no back pain, no cough, no diaphoresis, no dizziness, no fatigue, no fever, no headache, no lower extremity edema, no nausea, no near-syncope, no orthopnea, no palpitations, no shortness of breath, no syncope, not vomiting and no weakness   Risk factors: no aortic disease, no birth control, no coronary artery disease, no diabetes mellitus, no high cholesterol, no hypertension, no immobilization, not obese, not pregnant, no prior DVT/PE, no smoking and no surgery     Past Medical History  Diagnosis Date  . Arthritis   . Rectal pain   . Constipation   . Hemorrhoids    Past Surgical History  Procedure Laterality Date  . Abdominal hysterectomy  11/2009  . Hemorrhoid surgery     Family History  Problem Relation Age of Onset  . Heart disease Mother   . Cancer Brother    History  Substance Use Topics  . Smoking status: Never Smoker   . Smokeless tobacco: Never Used  . Alcohol Use: No   OB History    No data available     Review of  Systems  Constitutional: Negative for fever, diaphoresis and fatigue.  Respiratory: Negative for cough and shortness of breath.   Cardiovascular: Positive for chest pain. Negative for palpitations, orthopnea, syncope and near-syncope.  Gastrointestinal: Negative for nausea, vomiting and abdominal pain.  Musculoskeletal: Negative for myalgias and back pain.  Skin: Negative for rash.  Neurological: Negative for dizziness, weakness, light-headedness and headaches.  Hematological: Negative for adenopathy. Does not bruise/bleed easily.  All other systems reviewed and are negative.     Allergies  Review of patient's allergies indicates no known allergies.  Home Medications   Prior to Admission medications   Medication Sig Start Date End Date Taking? Authorizing Provider  ALPRAZolam (XANAX) 0.25 MG tablet Take 1 tablet (0.25 mg total) by mouth 3 (three) times daily as needed for sleep or anxiety. 04/10/13   Wendie Agreste, MD   BP 183/100 mmHg  Pulse 114  Temp(Src) 99 F (37.2 C) (Oral)  Resp 18  Ht 5\' 3"  (1.6 m)  Wt 152 lb (68.947 kg)  BMI 26.93 kg/m2  SpO2 100% Physical Exam  Constitutional: She is oriented to person, place, and time. She appears well-developed and well-nourished.  HENT:  Head: Normocephalic and atraumatic.  Right Ear: External ear normal.  Left Ear: External ear normal.  Mouth/Throat: Oropharynx is clear and moist.  Eyes: Conjunctivae and EOM are normal. Pupils are equal, round, and reactive to light.  Neck: Normal range of motion.  Neck supple.  Cardiovascular: Normal rate, regular rhythm, normal heart sounds and intact distal pulses.   Pulmonary/Chest: Effort normal and breath sounds normal. No respiratory distress. She has no wheezes. She has no rales. She exhibits no tenderness.  Abdominal: Soft. Bowel sounds are normal. She exhibits no distension and no mass. There is no tenderness. There is no rebound and no guarding.  Musculoskeletal: Normal range of  motion.  Neurological: She is alert and oriented to person, place, and time.  Skin: Skin is warm and dry.  Nursing note and vitals reviewed.   ED Course  Procedures (including critical care time) Labs Review Labs Reviewed  BASIC METABOLIC PANEL - Abnormal; Notable for the following:    Potassium 3.2 (*)    GFR calc non Af Amer 80 (*)    All other components within normal limits  CBC WITH DIFFERENTIAL/PLATELET  URINALYSIS, ROUTINE W REFLEX MICROSCOPIC  I-STAT TROPOININ, ED  I-STAT TROPOININ, ED    Imaging Review Dg Chest 2 View  02/11/2015   CLINICAL DATA:  Left chest pain and shortness of breath for 1 week.  EXAM: CHEST  2 VIEW  COMPARISON:  12/06/2014 and 06/05/2006  FINDINGS: The heart size and mediastinal contours are within normal limits. Both lungs are clear. The visualized skeletal structures are unremarkable.  IMPRESSION: No active cardiopulmonary disease.   Electronically Signed   By: Van Clines M.D.   On: 02/11/2015 20:28     EKG Interpretation   Date/Time:  Sunday February 11 2015 19:55:42 EST Ventricular Rate:  95 PR Interval:  170 QRS Duration: 72 QT Interval:  352 QTC Calculation: 442 R Axis:   42 Text Interpretation:  Normal sinus rhythm Possible Left atrial enlargement  Septal infarct , age undetermined Abnormal ECG No significant change since  last tracing Confirmed by POLLINA  MD, CHRISTOPHER [phone]) on 02/11/2015  9:29:52 PM      MDM   Final diagnoses:  Chest pain, unspecified chest pain type   49 yo F no significant PMHx presents with CC of CP.   Physical exam as above.  Pt afebrile, tachy in triage, but normal HR in room; hypertensive to 183/100 in traige.  Satting 100%.  Pt states currently asymptomatic.  BP has been high this week to 180's, but pt reports does not have hx of HTN.    Wells negative.  Cannot PERC 2/2 age, but pt without risk factors for PE/DVT.  HEAR score is 2.    Plan for CXR, CBC, BMP, and delta troponin.  If delta  troponin WNL, pt should be able to be d/c home if continues to be asymptomatic.  8:53 PM  CXR WNL.  CBC WNL.  K slightly low 3.2, otherwise BMP WNL.  UA negative.  Initial troponin was 0.00.  Pt remains asymptomatic.  Will await delta troponin and if negative pt may be d/c home with f/u.   Delta troponin WNL.  Pt initially hypertensive to 183/100, but down to 113/79 after rest.  Likely related to anxiety.   D/c home in good condition.  F/u with PCP in 1 week.  Return precautions given.  Pt understands and agrees with plan.   Sinda Du  Discussed pt with Dr. Betsey Holiday.      Sinda Du, MD 02/12/15 6045  Orpah Greek, MD 02/12/15 [phone]

## 2015-02-11 NOTE — ED Notes (Signed)
Resident at bedside.  

## 2015-02-12 LAB — I-STAT TROPONIN, ED
Troponin i, poc: 0 ng/mL (ref 0.00–0.08)
Troponin i, poc: 0.01 ng/mL (ref 0.00–0.08)

## 2015-02-12 NOTE — Discharge Instructions (Signed)

## 2015-08-09 ENCOUNTER — Other Ambulatory Visit: Payer: Self-pay | Admitting: Obstetrics and Gynecology

## 2015-08-10 LAB — CYTOLOGY - PAP

## 2015-12-05 ENCOUNTER — Encounter (HOSPITAL_COMMUNITY): Payer: Self-pay | Admitting: Emergency Medicine

## 2015-12-05 ENCOUNTER — Emergency Department (HOSPITAL_COMMUNITY): Payer: Managed Care, Other (non HMO)

## 2015-12-05 ENCOUNTER — Emergency Department (HOSPITAL_COMMUNITY)
Admission: EM | Admit: 2015-12-05 | Discharge: 2015-12-05 | Disposition: A | Discharge status: Home / Self Care | Payer: Managed Care, Other (non HMO) | Attending: Emergency Medicine | Admitting: Emergency Medicine

## 2015-12-05 DIAGNOSIS — Z8719 Personal history of other diseases of the digestive system: Secondary | ICD-10-CM | POA: Insufficient documentation

## 2015-12-05 DIAGNOSIS — M1990 Unspecified osteoarthritis, unspecified site: Secondary | ICD-10-CM | POA: Insufficient documentation

## 2015-12-05 DIAGNOSIS — R079 Chest pain, unspecified: Secondary | ICD-10-CM

## 2015-12-05 LAB — CBC WITH DIFFERENTIAL/PLATELET
Basophils Absolute: 0 10*3/uL (ref 0.0–0.1)
Basophils Relative: 0 %
Eosinophils Absolute: 0.1 10*3/uL (ref 0.0–0.7)
Eosinophils Relative: 1 %
HCT: 43.8 % (ref 36.0–46.0)
Hemoglobin: 14.2 g/dL (ref 12.0–15.0)
Lymphocytes Relative: 25 %
Lymphs Abs: 1.9 10*3/uL (ref 0.7–4.0)
MCH: 30.3 pg (ref 26.0–34.0)
MCHC: 32.4 g/dL (ref 30.0–36.0)
MCV: 93.4 fL (ref 78.0–100.0)
Monocytes Absolute: 0.5 10*3/uL (ref 0.1–1.0)
Monocytes Relative: 7 %
Neutro Abs: 5.2 10*3/uL (ref 1.7–7.7)
Neutrophils Relative %: 67 %
Platelets: 241 10*3/uL (ref 150–400)
RBC: 4.69 MIL/uL (ref 3.87–5.11)
RDW: 14.4 % (ref 11.5–15.5)
WBC: 7.8 10*3/uL (ref 4.0–10.5)

## 2015-12-05 LAB — BASIC METABOLIC PANEL
Anion gap: 9 (ref 5–15)
BUN: 11 mg/dL (ref 6–20)
CO2: 27 mmol/L (ref 22–32)
Calcium: 9.6 mg/dL (ref 8.9–10.3)
Chloride: 104 mmol/L (ref 101–111)
Creatinine, Ser: 0.73 mg/dL (ref 0.44–1.00)
GFR calc Af Amer: >60 mL/min (ref >60)
GFR calc non Af Amer: >60 mL/min (ref >60)
Glucose, Bld: 90 mg/dL (ref 65–99)
Potassium: 4.1 mmol/L (ref 3.5–5.1)
Sodium: 140 mmol/L (ref 135–145)

## 2015-12-05 LAB — I-STAT TROPONIN, ED: Troponin i, poc: 0.01 ng/mL (ref 0.00–0.08)

## 2015-12-05 NOTE — ED Notes (Signed)
Pt from PCP office via [place] with c/o 1/10 chest pressure/discomfort since 5 am today.  Pt reports pain has decreased, went to PCP who sent her here for further evaluation.  Pt denies N/V/SOB or dizziness. Pain worse with deep breathing or movement.  Given 324 mg aspirin.  NAD, A&O.

## 2015-12-05 NOTE — ED Provider Notes (Signed)
CSN: [id number]     Arrival date & time 12/05/15  1159 History   First MD Initiated Contact with Patient 12/05/15 1205     Chief Complaint  Patient presents with  . Chest Pain    HPI   49 year old female presents today with chest pain. Patient reports that she woke up 3 AM to walk her dogs without any significant pain or difficulty. She reports lying back in bed and was awoken at 5 AM with substernal chest pressure. She followed up at her primary care provider office who did an EKG with no significant findings. Due to patient's discomfort EMS was called and transferred to the emergency room. Therefore giving patient 324 mg of aspirin in route. At time of evaluation patient reports symptoms have improved but are still present. She continues to endorse chest pressure, she denies any associated shortness of breath, cough, diaphoresis, nausea, vomiting, abdominal pain, or radiation. Patient denies any history of the same. Patient reports she is a nonsmoker, with no history of hypertension, high cholesterol, personal cardiac history. Patient reports her mother has a significant cardiac history of MI while in her 75s. She denies any preceding illness, fever, chills, nausea, vomiting. She reports that she does exercise and has some upper chest soreness, but feels this is different.   Past Medical History  Diagnosis Date  . Arthritis   . Rectal pain   . Constipation   . Hemorrhoids    Past Surgical History  Procedure Laterality Date  . Abdominal hysterectomy  11/2009  . Hemorrhoid surgery     Family History  Problem Relation Age of Onset  . Heart disease Mother   . Cancer Brother    Social History  Substance Use Topics  . Smoking status: Never Smoker   . Smokeless tobacco: Never Used  . Alcohol Use: No   OB History    No data available     Review of Systems  All other systems reviewed and are negative.   Allergies  Review of patient's allergies indicates no known  allergies.  Home Medications   Prior to Admission medications   Medication Sig Start Date End Date Taking? Authorizing Provider  ibuprofen (ADVIL,MOTRIN) 200 MG tablet Take 400 mg by mouth every 6 (six) hours as needed for moderate pain.   Yes Historical Provider, MD  Naproxen Sodium (ALEVE) 220 MG CAPS Take 220 mg by mouth every 8 (eight) hours as needed.   Yes Historical Provider, MD  ALPRAZolam (XANAX) 0.25 MG tablet Take 1 tablet (0.25 mg total) by mouth 3 (three) times daily as needed for sleep or anxiety. 04/10/13   Wendie Agreste, MD   BP 135/87 mmHg  Pulse 62  Temp(Src) 97.6 F (36.4 C) (Oral)  Resp 14  SpO2 100%   Physical Exam  Constitutional: She is oriented to person, place, and time. She appears well-developed and well-nourished.  HENT:  Head: Normocephalic and atraumatic.  Eyes: Conjunctivae are normal. Pupils are equal, round, and reactive to light. Right eye exhibits no discharge. Left eye exhibits no discharge. No scleral icterus.  Neck: Normal range of motion. No JVD present. No tracheal deviation present.  Cardiovascular: Normal rate, regular rhythm, normal heart sounds and intact distal pulses.  Exam reveals no gallop and no friction rub.   No murmur heard. Bilateral radial pulses equal  Pulmonary/Chest: Effort normal. No stridor.  Abdominal: Soft. She exhibits no distension and no mass. There is no tenderness. There is no rebound and no guarding.  Musculoskeletal:  Normal range of motion. She exhibits no edema or tenderness.  Neurological: She is alert and oriented to person, place, and time. Coordination normal.  Skin: Skin is warm and dry. No rash noted. No erythema. No pallor.  Psychiatric: She has a normal mood and affect. Her behavior is normal. Judgment and thought content normal.  Nursing note and vitals reviewed.   ED Course  Procedures (including critical care time) Labs Review Labs Reviewed  CBC WITH DIFFERENTIAL/PLATELET  BASIC METABOLIC PANEL   TROPONIN I  Randolm Idol, ED    Imaging Review Dg Chest 2 View  12/05/2015  CLINICAL DATA:  Chest pain/ tightness, painful breathing EXAM: CHEST  2 VIEW COMPARISON:  02/11/2015 FINDINGS: Lungs are clear.  No pleural effusion or pneumothorax. The heart is normal size. Visualized osseous structures are within normal limits. IMPRESSION: Normal chest radiographs. Electronically Signed   By: Julian Hy M.D.   On: 12/05/2015 12:38   I have personally reviewed and evaluated these images and lab results as part of my medical decision-making.   EKG Interpretation   Date/Time:  Wednesday December 05 2015 12:14:58 EST Ventricular Rate:  66 PR Interval:  149 QRS Duration: 90 QT Interval:  394 QTC Calculation: 413 R Axis:   -20 Text Interpretation:  Sinus rhythm Borderline left axis deviation  Anteroseptal infarct, old No significant change since last tracing  Confirmed by [place]  MD, STEPHEN ([id number]) on 12/05/2015 2:04:24 PM      MDM   Final diagnoses:  Chest pain, unspecified chest pain type    Labs: I-STAT troponin, CBC, BMP  Imaging: DG EKG, chest x-ray  Consults:  Therapeutics: 325 aspirin prior to arrival  Discharge Meds:   Assessment/Plan: 44 YOF presents with chest pain. Described as pressure. Patient was able to walk her dogs this morning without significant pain or difficulty, her pain has improved since initial onset with no worsening. She has reassuring vital signs, no signs of DVT or PE, and has heart score of 2, Wells 0. Troponin here negative, EKG on concerning, no sick findings on DG chest. Patient's presentation likely musculoskeletal in nature, low suspicion for ACS, PE, infectious. Patient will be discharged home with strict return precautions, primary care follow-up symptoms persist, return to emergency room if they worsen. Both patient and her son verbalized understanding and agreement with this plan and had no further questions or concerns at time of  discharge         Okey Regal, PA-C 12/05/15 [place], MD 12/06/15 [phone]

## 2015-12-05 NOTE — ED Notes (Signed)
MD Kohut at the bedside.  

## 2015-12-05 NOTE — Discharge Instructions (Signed)
Please read attached information. If you experience any new or worsening signs or symptoms please return to the emergency room for evaluation. Please follow-up with your primary care provider or specialist as discussed. Please use medication prescribed only as directed and discontinue taking if you have any concerning signs or symptoms.   °

## 2016-01-02 ENCOUNTER — Other Ambulatory Visit: Payer: Self-pay | Admitting: Radiology

## 2016-03-03 ENCOUNTER — Encounter: Payer: Self-pay | Admitting: Diagnostic Neuroimaging

## 2016-03-03 ENCOUNTER — Ambulatory Visit (INDEPENDENT_AMBULATORY_CARE_PROVIDER_SITE_OTHER): Payer: BLUE CROSS/BLUE SHIELD | Admitting: Diagnostic Neuroimaging

## 2016-03-03 VITALS — BP 140/90 | HR 77 | Ht 63.0 in | Wt 158.4 lb

## 2016-03-03 DIAGNOSIS — G43109 Migraine with aura, not intractable, without status migrainosus: Secondary | ICD-10-CM | POA: Diagnosis not present

## 2016-03-03 MED ORDER — SUMATRIPTAN SUCCINATE 50 MG PO TABS
50.0000 mg | ORAL_TABLET | ORAL | Status: DC | PRN
Start: 2016-03-03 — End: 2016-08-05

## 2016-03-03 NOTE — Patient Instructions (Signed)
Thank you for coming to see [place] at [facility]. I hope we have been able to provide you high quality care today.  You may receive a patient satisfaction survey over the next few weeks. We would appreciate your feedback and comments so that we may continue to improve ourselves and the health of our patients.  - sumatriptan 63m as needed for breakthrough headache; may repeat x 1 after 2 hours; max 2 tabs per day or 8 per month - start headache diary to monitor for triggering factors    ~~~~~~~~~~~~~~~~~~~~~~~~~~~~~~~~~~~~~~~~~~~~~~~~~~~~~~~~~~~~~~~~~  DR. [place] Flett'S GUIDE TO HAPPY AND HEALTHY LIVING These are some of my general health and wellness recommendations. Some of them may apply to you better than others. Please use common sense as you try these suggestions and feel free to ask me any questions.   ACTIVITY/FITNESS Mental, social, emotional and physical stimulation are very important for brain and body health. Try learning a new activity (arts, music, language, sports, games).  Keep moving your body to the best of your abilities. You can do this at home, inside or outside, the park, community center, gym or anywhere you like. Consider a physical therapist or personal trainer to get started. Consider the app Sworkit. Fitness trackers such as smart-watches, smart-phones or Fitbits can help as well.   NUTRITION Eat more plants: colorful vegetables, nuts, seeds and berries.  Eat less sugar, salt, preservatives and processed foods.  Avoid toxins such as cigarettes and alcohol.  Drink water when you are thirsty. Warm water with a slice of lemon is an excellent morning drink to start the day.  Consider these websites for more information The Nutrition Source (hhttps://www.henry-hernandez.biz/ [employer] Nutrition (wWindowBlog.ch   RELAXATION Consider practicing mindfulness meditation or other relaxation techniques such as  deep breathing, prayer, yoga, tai chi, massage. See website mindful.org or the apps Headspace or Calm to help get started.   SLEEP Try to get at least 7-8+ hours sleep per day. Regular exercise and reduced caffeine will help you sleep better. Practice good sleep hygeine techniques. See website sleep.org for more information.   PLANNING Prepare estate planning, living will, healthcare POA documents. Sometimes this is best planned with the help of an attorney. Theconversationproject.org and agingwithdignity.org are excellent resources.

## 2016-03-03 NOTE — Progress Notes (Signed)
[facility]  PATIENT: Megan Castillo DOB: [birth year]  REFERRING CLINICIAN: Elie Goody HISTORY FROM: patient  REASON FOR VISIT: new consult    HISTORICAL  CHIEF COMPLAINT:  Chief Complaint  Patient presents with  . Migraine    rm 7, New pt, "migraines for years, thought it was allergies, tried Aleve, Sumatriptan which is sometimes helpful    HISTORY OF PRESENT ILLNESS:   50 year old female here for evaluation of headaches. For past 3-4 years patient has had onset of unilateral, sometimes bilateral, periorbital severe intense headache and pain. Patient typically lays down, turns down the lights, uses warm washcloth to help relieve symptoms. Patient feels tired and foggy after headaches. Sometimes she has decreased peripheral vision and scotoma during the headaches. Patient has some sensitivity to light. No nausea or vomiting. She has some dizziness. She has some sinus pressure.  Patient thought she had sinus headaches for many years and went to ENT recently for evaluation. Patient was told she may have migraine headaches and referred to me for further evaluation.  Patient having approximately 2-4 days of migraine headache per month. Triggering factors include change of weather, change of season, decreased sleep. Patient tried sumatriptan in the past with mild relief.    REVIEW OF SYSTEMS: Full 14 system review of systems performed and negative with exception of: Insomnia snoring rectal bleeding constipation headache per month allergies.  ALLERGIES: No Known Allergies  HOME MEDICATIONS: Outpatient Prescriptions Prior to Visit  Medication Sig Dispense Refill  . Naproxen Sodium (ALEVE) 220 MG CAPS Take 220 mg by mouth every 8 (eight) hours as needed. Reported on 03/03/2016    . ALPRAZolam (XANAX) 0.25 MG tablet Take 1 tablet (0.25 mg total) by mouth 3 (three) times daily as needed for sleep or anxiety. 20 tablet 0  . ibuprofen (ADVIL,MOTRIN) 200 MG tablet Take 400  mg by mouth every 6 (six) hours as needed for moderate pain.     No facility-administered medications prior to visit.    PAST MEDICAL HISTORY: Past Medical History  Diagnosis Date  . Arthritis   . Rectal pain   . Constipation   . Hemorrhoids     PAST SURGICAL HISTORY: Past Surgical History  Procedure Laterality Date  . Abdominal hysterectomy  11/2009  . Hemorrhoid surgery      FAMILY HISTORY: Family History  Problem Relation Age of Onset  . Heart disease Mother   . Cancer Brother 87    Burkett lymphoma    SOCIAL HISTORY:  Social History   Social History  . Marital Status: Married    Spouse Name: Elta [place]  . Number of Children: 2  . Years of Education: 51 1/2   Occupational History  .      Systems Engineering   Social History Main Topics  . Smoking status: Never Smoker   . Smokeless tobacco: Never Used  . Alcohol Use: No  . Drug Use: No  . Sexual Activity: Yes   Other Topics Concern  . Not on file   Social History Narrative   Lives at home with husband   Caffeine use-tea twice a day     PHYSICAL EXAM  GENERAL EXAM/CONSTITUTIONAL: Vitals:  Filed Vitals:   03/03/16 0925 03/03/16 0929  BP: 136/97 140/90  Pulse: 71 77  Height: 5\' 3"  (1.6 m)   Weight: 158 lb 6.4 oz (71.85 kg)      Body mass index is 28.07 kg/(m^2).  No exam data present  Patient is in no  distress; well developed, nourished and groomed; neck is supple  CARDIOVASCULAR:  Examination of carotid arteries is normal; no carotid bruits  Regular rate and rhythm, no murmurs  Examination of peripheral vascular system by observation and palpation is normal  EYES:  Ophthalmoscopic exam of optic discs and posterior segments is normal; no papilledema or hemorrhages  MUSCULOSKELETAL:  Gait, strength, tone, movements noted in Neurologic exam below  NEUROLOGIC: MENTAL STATUS:  No flowsheet data found.  awake, alert, oriented to person, place and time  recent and remote memory  intact  normal attention and concentration  language fluent, comprehension intact, naming intact,   fund of knowledge appropriate  CRANIAL NERVE:   2nd - no papilledema on fundoscopic exam  2nd, 3rd, 4th, 6th - pupils equal and reactive to light, visual fields full to confrontation, extraocular muscles intact, no nystagmus  5th - facial sensation symmetric  7th - facial strength symmetric  8th - hearing intact  9th - palate elevates symmetrically, uvula midline  11th - shoulder shrug symmetric  12th - tongue protrusion midline  MOTOR:   normal bulk and tone, full strength in the BUE, BLE  SENSORY:   normal and symmetric to light touch, pinprick, temperature, vibration  COORDINATION:   finger-nose-finger, fine finger movements normal  REFLEXES:   deep tendon reflexes present and symmetric  GAIT/STATION:   narrow based gait; able to walk on toes, heels and tandem; romberg is negative    DIAGNOSTIC DATA (LABS, IMAGING, TESTING) - I reviewed patient records, labs, notes, testing and imaging myself where available.  Lab Results  Component Value Date   WBC 7.8 12/05/2015   HGB 14.2 12/05/2015   HCT 43.8 12/05/2015   MCV 93.4 12/05/2015   PLT 241 12/05/2015      Component Value Date/Time   NA 140 12/05/2015 1217   K 4.1 12/05/2015 1217   CL 104 12/05/2015 1217   CO2 27 12/05/2015 1217   GLUCOSE 90 12/05/2015 1217   BUN 11 12/05/2015 1217   CREATININE 0.73 12/05/2015 1217   CREATININE 0.70 04/10/2013 1702   CALCIUM 9.6 12/05/2015 1217   PROT 7.4 04/10/2013 1702   ALBUMIN 4.5 04/10/2013 1702   AST 28 04/10/2013 1702   ALT 28 04/10/2013 1702   ALKPHOS 80 04/10/2013 1702   BILITOT 0.3 04/10/2013 1702   GFRNONAA >60 12/05/2015 1217   GFRAA >60 12/05/2015 1217   No results found for: CHOL, HDL, LDLCALC, LDLDIRECT, TRIG, CHOLHDL No results found for: HGBA1C No results found for: VITAMINB12 No results found for: TSH   02/09/13 MRI brain (with and  without contrast) 1. Few punctate subcortical, periventricular and juxtacortical foci of gliosis. These findings are non-specific and considerations include autoimmune, inflammatory, post-infectious, microvascular ischemic or migraine associated etiologies.  2. No abnormal enhancing lesions.     ASSESSMENT AND PLAN  50 y.o. year old female here with 3-4 years of headaches with migraine features. Symptoms started around 2013. Neurologic examination MRI of the brain are unremarkable. Reassured patient in terms of diagnosis and treatment options. Patient had been using sumatriptan 100 mg tablets but cutting these into one quarter or one thirds tablet fragments because higher dose made her have undesirable side effects. We'll give her 50 mg tablet size of sumatriptan and see how she does. Patient may take this with over-the-counter Tylenol or ibuprofen for relief. Advised on migraine treatment strategies, including early treatment, trigger management, and starting a headache diary.   Dx:  1. 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 SUMAtriptan (IMITREX) 50 MG tablet    Sig: Take 1 tablet (50 mg total) by mouth as needed for migraine. May repeat x 1 after 2 hours; max 2 tabs per day or 8 tabs per month    Dispense:  8 tablet    Refill:  6   Return in about 2 months (around 05/03/2016).    Penni Bombard, MD [id number], [id number] AM Certified in Neurology, Neurophysiology and Neuroimaging  [facility] [address] [place], [place] [address] [phone]

## 2016-05-20 ENCOUNTER — Ambulatory Visit: Payer: BLUE CROSS/BLUE SHIELD | Admitting: Diagnostic Neuroimaging

## 2016-06-09 ENCOUNTER — Other Ambulatory Visit: Payer: Self-pay | Admitting: General Surgery

## 2016-06-09 DIAGNOSIS — L919 Hypertrophic disorder of the skin, unspecified: Secondary | ICD-10-CM | POA: Diagnosis not present

## 2016-06-09 DIAGNOSIS — K625 Hemorrhage of anus and rectum: Secondary | ICD-10-CM | POA: Diagnosis not present

## 2016-06-12 ENCOUNTER — Ambulatory Visit: Payer: BLUE CROSS/BLUE SHIELD | Admitting: Diagnostic Neuroimaging

## 2016-06-19 ENCOUNTER — Ambulatory Visit: Payer: BLUE CROSS/BLUE SHIELD | Admitting: Diagnostic Neuroimaging

## 2016-06-26 DIAGNOSIS — R921 Mammographic calcification found on diagnostic imaging of breast: Secondary | ICD-10-CM | POA: Diagnosis not present

## 2016-07-03 ENCOUNTER — Ambulatory Visit: Payer: BLUE CROSS/BLUE SHIELD | Admitting: Diagnostic Neuroimaging

## 2016-07-04 ENCOUNTER — Encounter: Payer: Self-pay | Admitting: Diagnostic Neuroimaging

## 2016-07-31 DIAGNOSIS — Z1211 Encounter for screening for malignant neoplasm of colon: Secondary | ICD-10-CM | POA: Diagnosis not present

## 2016-07-31 DIAGNOSIS — K5900 Constipation, unspecified: Secondary | ICD-10-CM | POA: Diagnosis not present

## 2016-08-05 ENCOUNTER — Ambulatory Visit (INDEPENDENT_AMBULATORY_CARE_PROVIDER_SITE_OTHER): Payer: BLUE CROSS/BLUE SHIELD | Admitting: Diagnostic Neuroimaging

## 2016-08-05 ENCOUNTER — Encounter: Payer: Self-pay | Admitting: Diagnostic Neuroimaging

## 2016-08-05 VITALS — BP 134/87 | HR 69 | Wt 158.4 lb

## 2016-08-05 DIAGNOSIS — G43109 Migraine with aura, not intractable, without status migrainosus: Secondary | ICD-10-CM | POA: Diagnosis not present

## 2016-08-05 MED ORDER — SUMATRIPTAN SUCCINATE 50 MG PO TABS: 50.0000 mg | ORAL_TABLET | ORAL | 6 refills | Status: DC | PRN

## 2016-08-05 NOTE — Patient Instructions (Signed)
To prevent or relieve headaches, try the following:   Cool Compress. Lie down and place a cool compress on your head.   Avoid headache triggers. If certain foods or odors seem to have triggered your migraines in the past, avoid them. A headache diary might help you identify triggers.   Include physical activity in your daily routine.   Manage stress. Find healthy ways to cope with the stressors, such as delegating tasks on your to-do list.   Practice relaxation techniques. Try deep breathing, yoga, massage and visualization.   Eat regularly. Eating regularly scheduled meals and maintaining a healthy diet might help prevent headaches. Also, drink plenty of fluids.   Follow a regular sleep schedule. Sleep deprivation might contribute to headaches  Consider biofeedback. With this mind-body technique, you learn to control certain bodily functions -- such as muscle tension, heart rate and blood pressure -- to prevent headaches or reduce headache pain.

## 2016-08-05 NOTE — Progress Notes (Signed)
[facility]  PATIENT: Megan Castillo DOB: November 28, 1966  REFERRING CLINICIAN: Elie Goody HISTORY FROM: patient  REASON FOR VISIT: follow up    HISTORICAL  CHIEF COMPLAINT:  Chief Complaint  Patient presents with  . Migraine    rm 7, "migraines doing better"  . Follow-up    HISTORY OF PRESENT ILLNESS:   UPDATE 08/05/16: Since last visit, doing well with headaches (1 every month or other month). Sumatriptan 50mg  daily well. Still with high levels of stress (husband just d/c'd from hospital today for MI).   PRIOR HPI (03/03/16): 50 year old female here for evaluation of headaches. For past 3-4 years patient has had onset of unilateral, sometimes bilateral, periorbital severe intense headache and pain. Patient typically lays down, turns down the lights, uses warm washcloth to help relieve symptoms. Patient feels tired and foggy after headaches. Sometimes she has decreased peripheral vision and scotoma during the headaches. Patient has some sensitivity to light. No nausea or vomiting. She has some dizziness. She has some sinus pressure. Patient thought she had sinus headaches for many years and went to ENT recently for evaluation. Patient was told she may have migraine headaches and referred to me for further evaluation. Patient having approximately 2-4 days of migraine headache per month. Triggering factors include change of weather, change of season, decreased sleep. Patient tried sumatriptan in the past with mild relief.    REVIEW OF SYSTEMS: Full 14 system review of systems performed and negative with exception of: blurred vision constipation.    ALLERGIES: No Known Allergies  HOME MEDICATIONS: Outpatient Medications Prior to Visit  Medication Sig Dispense Refill  . SUMAtriptan (IMITREX) 50 MG tablet Take 1 tablet (50 mg total) by mouth as needed for migraine. May repeat x 1 after 2 hours; max 2 tabs per day or 8 tabs per month 8 tablet 6  . Naproxen Sodium (ALEVE)  220 MG CAPS Take 220 mg by mouth every 8 (eight) hours as needed. Reported on 03/03/2016     No facility-administered medications prior to visit.     PAST MEDICAL HISTORY: Past Medical History:  Diagnosis Date  . Arthritis   . Constipation   . Hemorrhoids   . Rectal pain     PAST SURGICAL HISTORY: Past Surgical History:  Procedure Laterality Date  . ABDOMINAL HYSTERECTOMY  11/2009  . HEMORRHOID SURGERY      FAMILY HISTORY: Family History  Problem Relation Age of Onset  . Heart disease Mother   . Cancer Brother 60    Burkett lymphoma    SOCIAL HISTORY:  Social History   Social History  . Marital status: Married    Spouse name: Elta Guadeloupe  . Number of children: 2  . Years of education: 47 1/2   Occupational History  .      Systems Engineering   Social History Main Topics  . Smoking status: Never Smoker  . Smokeless tobacco: Never Used  . Alcohol use No  . Drug use: No  . Sexual activity: Yes   Other Topics Concern  . Not on file   Social History Narrative   Lives at home with husband   Caffeine use-tea twice a day     PHYSICAL EXAM  GENERAL EXAM/CONSTITUTIONAL: Vitals:  Vitals:   08/05/16 1444  BP: 134/87  Pulse: 69  Weight: 158 lb 6.4 oz (71.8 kg)   Body mass index is 28.06 kg/m. No exam data present  Patient is in no distress; well developed, nourished and groomed;  neck is supple  CARDIOVASCULAR:  Examination of carotid arteries is normal; no carotid bruits  Regular rate and rhythm, no murmurs  Examination of peripheral vascular system by observation and palpation is normal  EYES:  Ophthalmoscopic exam of optic discs and posterior segments is normal; no papilledema or hemorrhages  MUSCULOSKELETAL:  Gait, strength, tone, movements noted in Neurologic exam below  NEUROLOGIC: MENTAL STATUS:  No flowsheet data found.  awake, alert, oriented to person, place and time  recent and remote memory intact  normal attention and  concentration  language fluent, comprehension intact, naming intact,   fund of knowledge appropriate  CRANIAL NERVE:   2nd - no papilledema on fundoscopic exam  2nd, 3rd, 4th, 6th - pupils equal and reactive to light, visual fields full to confrontation, extraocular muscles intact, no nystagmus  5th - facial sensation symmetric  7th - facial strength symmetric  8th - hearing intact  9th - palate elevates symmetrically, uvula midline  11th - shoulder shrug symmetric  12th - tongue protrusion midline  MOTOR:   normal bulk and tone, full strength in the BUE, BLE  SENSORY:   normal and symmetric to light touch, temperature, vibration  COORDINATION:   finger-nose-finger, fine finger movements normal  REFLEXES:   deep tendon reflexes present and symmetric  GAIT/STATION:   narrow based gait; able to walk on tandem    DIAGNOSTIC DATA (LABS, IMAGING, TESTING) - I reviewed patient records, labs, notes, testing and imaging myself where available.  Lab Results  Component Value Date   WBC 7.8 12/05/2015   HGB 14.2 12/05/2015   HCT 43.8 12/05/2015   MCV 93.4 12/05/2015   PLT 241 12/05/2015      Component Value Date/Time   NA 140 12/05/2015 1217   K 4.1 12/05/2015 1217   CL 104 12/05/2015 1217   CO2 27 12/05/2015 1217   GLUCOSE 90 12/05/2015 1217   BUN 11 12/05/2015 1217   CREATININE 0.73 12/05/2015 1217   CREATININE 0.70 04/10/2013 1702   CALCIUM 9.6 12/05/2015 1217   PROT 7.4 04/10/2013 1702   ALBUMIN 4.5 04/10/2013 1702   AST 28 04/10/2013 1702   ALT 28 04/10/2013 1702   ALKPHOS 80 04/10/2013 1702   BILITOT 0.3 04/10/2013 1702   GFRNONAA >60 12/05/2015 1217   GFRAA >60 12/05/2015 1217   No results found for: CHOL, HDL, LDLCALC, LDLDIRECT, TRIG, CHOLHDL No results found for: HGBA1C No results found for: VITAMINB12 No results found for: TSH   02/09/13 MRI brain (with and without contrast) 1. Few punctate subcortical, periventricular and  juxtacortical foci of gliosis. These findings are non-specific and considerations include autoimmune, inflammatory, post-infectious, microvascular ischemic or migraine associated etiologies.  2. No abnormal enhancing lesions.     ASSESSMENT AND PLAN  50 y.o. year old female here with 3-4 years of headaches with migraine features. Symptoms started around 2013. Neurologic examination MRI of the brain are unremarkable. Reassured patient in terms of diagnosis and treatment options.   Doing well on sumatriptan 50mg  prn headache. Patient may take this with over-the-counter Tylenol or ibuprofen for relief. Advised on migraine treatment strategies, including early treatment, trigger management, and starting a headache diary.   Dx:  1. Migraine with aura and without status migrainosus, not intractable       PLAN:  Meds ordered this encounter  Medications  . SUMAtriptan (IMITREX) 50 MG tablet    Sig: Take 1 tablet (50 mg total) by mouth as needed for migraine. May repeat x 1 after  2 hours; max 2 tabs per day or 8 tabs per month    Dispense:  8 tablet    Refill:  6   Return in about 6 months (around 02/04/2017).    Penni Bombard, MD [id number], [id number] PM Certified in Neurology, Neurophysiology and Neuroimaging  [facility] [address] [place], [place] [address] [phone]

## 2016-08-08 DIAGNOSIS — R079 Chest pain, unspecified: Secondary | ICD-10-CM | POA: Diagnosis not present

## 2016-08-15 DIAGNOSIS — K219 Gastro-esophageal reflux disease without esophagitis: Secondary | ICD-10-CM | POA: Diagnosis not present

## 2016-08-15 DIAGNOSIS — R0789 Other chest pain: Secondary | ICD-10-CM | POA: Diagnosis not present

## 2016-08-15 DIAGNOSIS — E559 Vitamin D deficiency, unspecified: Secondary | ICD-10-CM | POA: Diagnosis not present

## 2016-08-15 DIAGNOSIS — Z8249 Family history of ischemic heart disease and other diseases of the circulatory system: Secondary | ICD-10-CM | POA: Diagnosis not present

## 2016-08-27 DIAGNOSIS — N951 Menopausal and female climacteric states: Secondary | ICD-10-CM | POA: Diagnosis not present

## 2016-08-27 DIAGNOSIS — Z01419 Encounter for gynecological examination (general) (routine) without abnormal findings: Secondary | ICD-10-CM | POA: Diagnosis not present

## 2016-08-27 DIAGNOSIS — Z6828 Body mass index (BMI) 28.0-28.9, adult: Secondary | ICD-10-CM | POA: Diagnosis not present

## 2016-09-02 DIAGNOSIS — M67431 Ganglion, right wrist: Secondary | ICD-10-CM | POA: Diagnosis not present

## 2016-09-02 DIAGNOSIS — M25631 Stiffness of right wrist, not elsewhere classified: Secondary | ICD-10-CM | POA: Diagnosis not present

## 2016-09-02 DIAGNOSIS — M25531 Pain in right wrist: Secondary | ICD-10-CM | POA: Diagnosis not present

## 2016-09-12 DIAGNOSIS — Z13820 Encounter for screening for osteoporosis: Secondary | ICD-10-CM | POA: Diagnosis not present

## 2016-09-24 DIAGNOSIS — D125 Benign neoplasm of sigmoid colon: Secondary | ICD-10-CM | POA: Diagnosis not present

## 2016-09-24 DIAGNOSIS — Z800 Family history of malignant neoplasm of digestive organs: Secondary | ICD-10-CM | POA: Diagnosis not present

## 2016-09-24 DIAGNOSIS — Z1211 Encounter for screening for malignant neoplasm of colon: Secondary | ICD-10-CM | POA: Diagnosis not present

## 2016-09-24 DIAGNOSIS — K635 Polyp of colon: Secondary | ICD-10-CM | POA: Diagnosis not present

## 2016-09-24 DIAGNOSIS — D123 Benign neoplasm of transverse colon: Secondary | ICD-10-CM | POA: Diagnosis not present

## 2016-09-30 DIAGNOSIS — R05 Cough: Secondary | ICD-10-CM | POA: Diagnosis not present

## 2016-11-05 DIAGNOSIS — N951 Menopausal and female climacteric states: Secondary | ICD-10-CM | POA: Diagnosis not present

## 2016-11-05 DIAGNOSIS — R635 Abnormal weight gain: Secondary | ICD-10-CM | POA: Diagnosis not present

## 2016-11-11 DIAGNOSIS — E663 Overweight: Secondary | ICD-10-CM | POA: Diagnosis not present

## 2016-11-11 DIAGNOSIS — E782 Mixed hyperlipidemia: Secondary | ICD-10-CM | POA: Diagnosis not present

## 2016-11-11 DIAGNOSIS — E538 Deficiency of other specified B group vitamins: Secondary | ICD-10-CM | POA: Diagnosis not present

## 2016-11-13 ENCOUNTER — Other Ambulatory Visit: Payer: Self-pay | Admitting: Gastroenterology

## 2016-11-13 DIAGNOSIS — R110 Nausea: Secondary | ICD-10-CM

## 2016-11-13 DIAGNOSIS — R1011 Right upper quadrant pain: Secondary | ICD-10-CM

## 2016-11-13 DIAGNOSIS — K5730 Diverticulosis of large intestine without perforation or abscess without bleeding: Secondary | ICD-10-CM | POA: Diagnosis not present

## 2016-11-13 DIAGNOSIS — R748 Abnormal levels of other serum enzymes: Secondary | ICD-10-CM | POA: Diagnosis not present

## 2016-11-13 DIAGNOSIS — R7989 Other specified abnormal findings of blood chemistry: Secondary | ICD-10-CM

## 2016-11-13 DIAGNOSIS — R945 Abnormal results of liver function studies: Principal | ICD-10-CM

## 2016-11-28 ENCOUNTER — Encounter (HOSPITAL_COMMUNITY)
Admission: RE | Admit: 2016-11-28 | Discharge: 2016-11-28 | Disposition: A | Discharge status: Home / Self Care | Payer: BLUE CROSS/BLUE SHIELD | Source: Ambulatory Visit | Attending: Gastroenterology | Admitting: Gastroenterology

## 2016-11-28 ENCOUNTER — Ambulatory Visit (HOSPITAL_COMMUNITY)
Admission: RE | Admit: 2016-11-28 | Discharge: 2016-11-28 | Disposition: A | Discharge status: Home / Self Care | Payer: BLUE CROSS/BLUE SHIELD | Source: Ambulatory Visit | Attending: Gastroenterology | Admitting: Gastroenterology

## 2016-11-28 DIAGNOSIS — K769 Liver disease, unspecified: Secondary | ICD-10-CM | POA: Diagnosis not present

## 2016-11-28 DIAGNOSIS — R7989 Other specified abnormal findings of blood chemistry: Secondary | ICD-10-CM | POA: Diagnosis not present

## 2016-11-28 DIAGNOSIS — R110 Nausea: Secondary | ICD-10-CM

## 2016-11-28 DIAGNOSIS — R1011 Right upper quadrant pain: Secondary | ICD-10-CM | POA: Insufficient documentation

## 2016-11-28 DIAGNOSIS — R945 Abnormal results of liver function studies: Secondary | ICD-10-CM

## 2016-11-28 MED ORDER — TECHNETIUM TC 99M MEBROFENIN IV KIT
5.4000 | PACK | Freq: Once | INTRAVENOUS | Status: AC | PRN
  Administered 2016-11-28: 5.4 via INTRAVENOUS

## 2016-12-03 DIAGNOSIS — M545 Low back pain: Secondary | ICD-10-CM | POA: Diagnosis not present

## 2016-12-05 ENCOUNTER — Other Ambulatory Visit: Payer: Self-pay | Admitting: Gastroenterology

## 2016-12-05 DIAGNOSIS — R935 Abnormal findings on diagnostic imaging of other abdominal regions, including retroperitoneum: Secondary | ICD-10-CM

## 2016-12-10 DIAGNOSIS — M546 Pain in thoracic spine: Secondary | ICD-10-CM | POA: Diagnosis not present

## 2016-12-14 ENCOUNTER — Ambulatory Visit
Admission: RE | Admit: 2016-12-14 | Discharge: 2016-12-14 | Disposition: A | Discharge status: Home / Self Care | Payer: BLUE CROSS/BLUE SHIELD | Source: Ambulatory Visit | Attending: Gastroenterology | Admitting: Gastroenterology

## 2016-12-14 DIAGNOSIS — K7689 Other specified diseases of liver: Secondary | ICD-10-CM | POA: Diagnosis not present

## 2016-12-14 DIAGNOSIS — R935 Abnormal findings on diagnostic imaging of other abdominal regions, including retroperitoneum: Secondary | ICD-10-CM

## 2016-12-14 MED ORDER — GADOBENATE DIMEGLUMINE 529 MG/ML IV SOLN
15.0000 mL | Freq: Once | INTRAVENOUS | Status: AC | PRN
  Administered 2016-12-14: 15 mL via INTRAVENOUS

## 2016-12-18 DIAGNOSIS — K769 Liver disease, unspecified: Secondary | ICD-10-CM | POA: Diagnosis not present

## 2016-12-29 DIAGNOSIS — Z1231 Encounter for screening mammogram for malignant neoplasm of breast: Secondary | ICD-10-CM | POA: Diagnosis not present

## 2017-01-23 DIAGNOSIS — E538 Deficiency of other specified B group vitamins: Secondary | ICD-10-CM | POA: Diagnosis not present

## 2017-01-23 DIAGNOSIS — E782 Mixed hyperlipidemia: Secondary | ICD-10-CM | POA: Diagnosis not present

## 2017-01-23 DIAGNOSIS — E663 Overweight: Secondary | ICD-10-CM | POA: Diagnosis not present

## 2017-01-30 DIAGNOSIS — E538 Deficiency of other specified B group vitamins: Secondary | ICD-10-CM | POA: Diagnosis not present

## 2017-01-30 DIAGNOSIS — E782 Mixed hyperlipidemia: Secondary | ICD-10-CM | POA: Diagnosis not present

## 2017-01-30 DIAGNOSIS — E663 Overweight: Secondary | ICD-10-CM | POA: Diagnosis not present

## 2017-03-03 DIAGNOSIS — R748 Abnormal levels of other serum enzymes: Secondary | ICD-10-CM | POA: Diagnosis not present

## 2017-03-16 DIAGNOSIS — K769 Liver disease, unspecified: Secondary | ICD-10-CM | POA: Diagnosis not present

## 2017-03-19 DIAGNOSIS — R0789 Other chest pain: Secondary | ICD-10-CM | POA: Diagnosis not present

## 2017-03-31 DIAGNOSIS — Z808 Family history of malignant neoplasm of other organs or systems: Secondary | ICD-10-CM | POA: Diagnosis not present

## 2017-03-31 DIAGNOSIS — D2239 Melanocytic nevi of other parts of face: Secondary | ICD-10-CM | POA: Diagnosis not present

## 2017-03-31 DIAGNOSIS — L821 Other seborrheic keratosis: Secondary | ICD-10-CM | POA: Diagnosis not present

## 2017-03-31 DIAGNOSIS — L814 Other melanin hyperpigmentation: Secondary | ICD-10-CM | POA: Diagnosis not present

## 2017-07-22 DIAGNOSIS — R109 Unspecified abdominal pain: Secondary | ICD-10-CM | POA: Diagnosis not present

## 2017-07-22 DIAGNOSIS — R140 Abdominal distension (gaseous): Secondary | ICD-10-CM | POA: Diagnosis not present

## 2017-07-22 DIAGNOSIS — R0789 Other chest pain: Secondary | ICD-10-CM | POA: Diagnosis not present

## 2017-08-05 DIAGNOSIS — Z13220 Encounter for screening for lipoid disorders: Secondary | ICD-10-CM | POA: Diagnosis not present

## 2017-08-05 DIAGNOSIS — Z1159 Encounter for screening for other viral diseases: Secondary | ICD-10-CM | POA: Diagnosis not present

## 2017-08-05 DIAGNOSIS — E559 Vitamin D deficiency, unspecified: Secondary | ICD-10-CM | POA: Diagnosis not present

## 2017-08-05 DIAGNOSIS — Z131 Encounter for screening for diabetes mellitus: Secondary | ICD-10-CM | POA: Diagnosis not present

## 2017-08-11 DIAGNOSIS — Z0000 Encounter for general adult medical examination without abnormal findings: Secondary | ICD-10-CM | POA: Diagnosis not present

## 2017-08-20 DIAGNOSIS — K219 Gastro-esophageal reflux disease without esophagitis: Secondary | ICD-10-CM | POA: Diagnosis not present

## 2017-08-20 DIAGNOSIS — Z8719 Personal history of other diseases of the digestive system: Secondary | ICD-10-CM | POA: Diagnosis not present

## 2017-08-20 DIAGNOSIS — K5909 Other constipation: Secondary | ICD-10-CM | POA: Diagnosis not present

## 2017-08-20 DIAGNOSIS — R1310 Dysphagia, unspecified: Secondary | ICD-10-CM | POA: Diagnosis not present

## 2017-08-31 DIAGNOSIS — E782 Mixed hyperlipidemia: Secondary | ICD-10-CM | POA: Diagnosis not present

## 2017-08-31 DIAGNOSIS — R748 Abnormal levels of other serum enzymes: Secondary | ICD-10-CM | POA: Diagnosis not present

## 2017-08-31 DIAGNOSIS — Z0000 Encounter for general adult medical examination without abnormal findings: Secondary | ICD-10-CM | POA: Diagnosis not present

## 2017-08-31 DIAGNOSIS — E039 Hypothyroidism, unspecified: Secondary | ICD-10-CM | POA: Diagnosis not present

## 2017-09-01 DIAGNOSIS — Z6827 Body mass index (BMI) 27.0-27.9, adult: Secondary | ICD-10-CM | POA: Diagnosis not present

## 2017-09-01 DIAGNOSIS — Z01419 Encounter for gynecological examination (general) (routine) without abnormal findings: Secondary | ICD-10-CM | POA: Diagnosis not present

## 2017-09-01 DIAGNOSIS — Z1321 Encounter for screening for nutritional disorder: Secondary | ICD-10-CM | POA: Diagnosis not present

## 2017-09-01 DIAGNOSIS — Z780 Asymptomatic menopausal state: Secondary | ICD-10-CM | POA: Diagnosis not present

## 2017-09-03 ENCOUNTER — Ambulatory Visit: Payer: BLUE CROSS/BLUE SHIELD | Admitting: Family Medicine

## 2017-09-14 DIAGNOSIS — K220 Achalasia of cardia: Secondary | ICD-10-CM | POA: Diagnosis not present

## 2017-09-14 DIAGNOSIS — K317 Polyp of stomach and duodenum: Secondary | ICD-10-CM | POA: Diagnosis not present

## 2017-09-14 DIAGNOSIS — K219 Gastro-esophageal reflux disease without esophagitis: Secondary | ICD-10-CM | POA: Diagnosis not present

## 2017-09-14 DIAGNOSIS — K210 Gastro-esophageal reflux disease with esophagitis: Secondary | ICD-10-CM | POA: Diagnosis not present

## 2017-09-14 DIAGNOSIS — R1313 Dysphagia, pharyngeal phase: Secondary | ICD-10-CM | POA: Diagnosis not present

## 2017-09-14 IMAGING — MR MR ABDOMEN WO/W CM
12 of 17 series · 29 of 48 positions shown · IV contrast (multihance)
Comparison: Ultrasound on 11/28/2016

CLINICAL DATA: Indeterminate liver lesion seen on recent
ultrasound. Elevated liver function tests and right upper quadrant
abdominal pain.

EXAM:
MRI ABDOMEN WITHOUT AND WITH CONTRAST
TECHNIQUE: Multiplanar multisequence MR imaging of the abdomen was performed
both before and after the administration of intravenous contrast.
CONTRAST:  15mL MULTIHANCE GADOBENATE DIMEGLUMINE 529 MG/ML IV SOLN

[Series 4: T2 · coronal · 5.0mm · 1.56mm/px · 1 of 36 slices shown (1 of 3)]
[im 1/36]
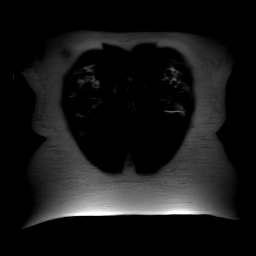

[Series 5: axial tru fisp · axial · 4.0mm · 1.48mm/px · 1 of 44 slices shown]
[im 1/44]
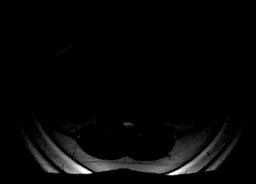

[Series 6: T2 · axial · 4.0mm · 1.37mm/px · 1 of 44 slices shown (2 of 3)]
[im 1/44]
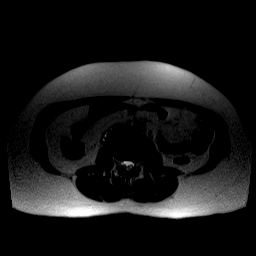

[Series 7: axial in out · axial · 4.5mm · 0.74mm/px · z∈[+20,+186]mm · 2 of 66 slices shown]
[im 1/66]
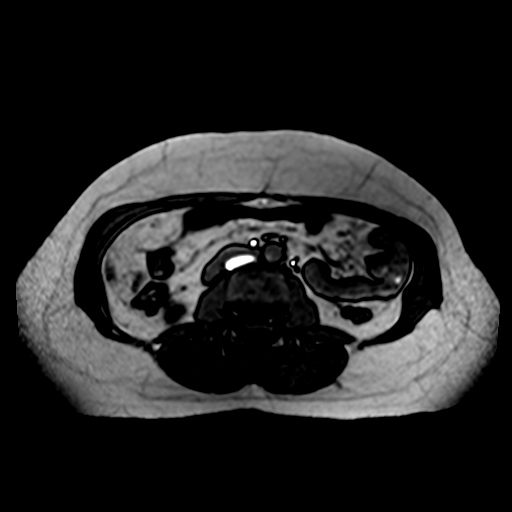
[im 66/66]
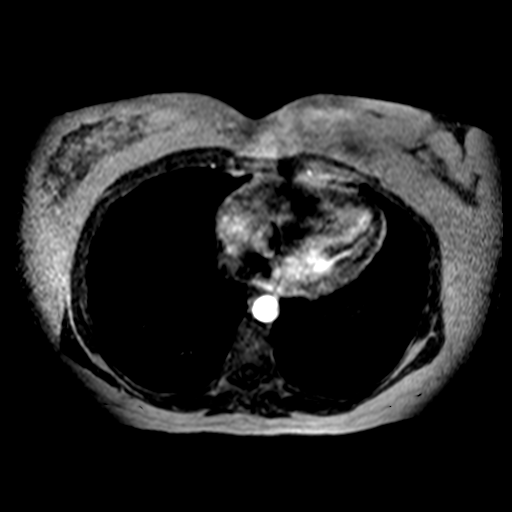

[Series 8: T2 · axial · 6.0mm · 0.74mm/px · 1 of 30 slices shown (3 of 3)]
[im 1/30]
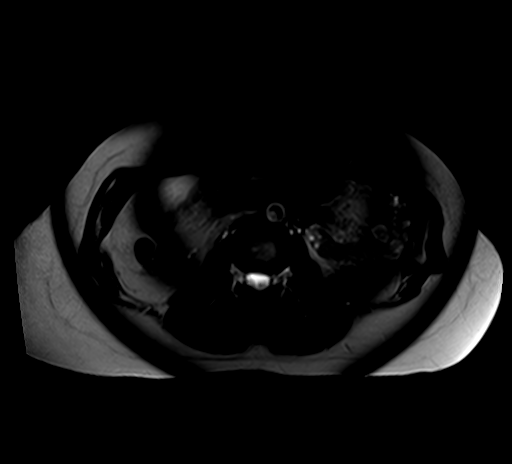

[Series 9: ep2d_diff_b50_500_800_p2 · axial · 6.0mm · 1.98mm/px · z∈[+22,+231]mm · 3 of 90 slices shown]
[im 1/90]
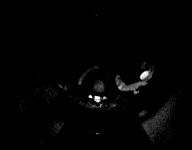
[im 45/90]
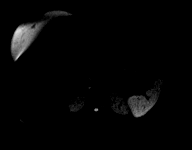
[im 90/90]
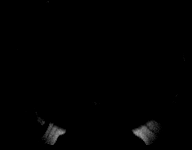

[Series 10: ep2d_[id number]_500_800_p2_adc · axial · 6.0mm · 1.98mm/px · 1 of 30 slices shown]
[im 1/30]
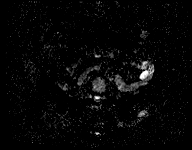

[Series 11: T1 dynamic · axial · non-contrast · 2.0mm · 0.70mm/px · z∈[+8,+198]mm · 4 of 96 slices shown]
[im 1/96]
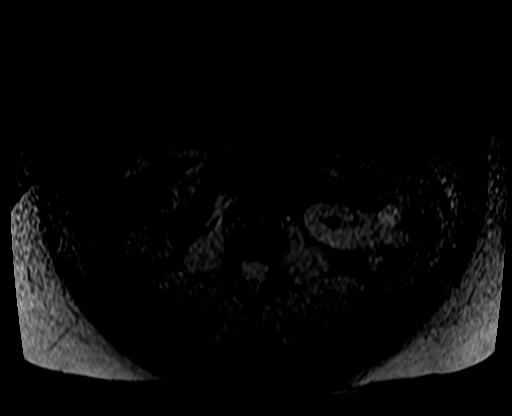
[im 32/96]
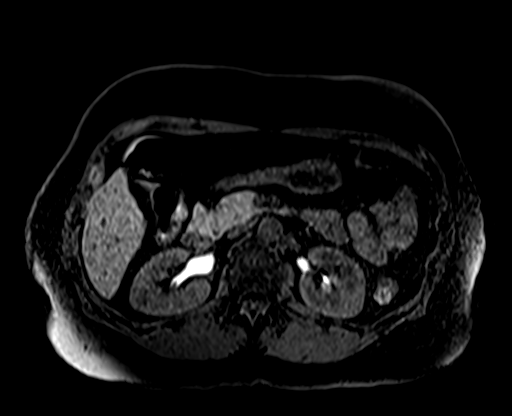
[im 64/96]
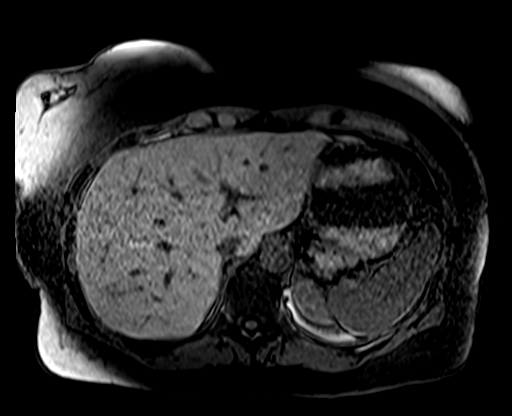
[im 96/96]
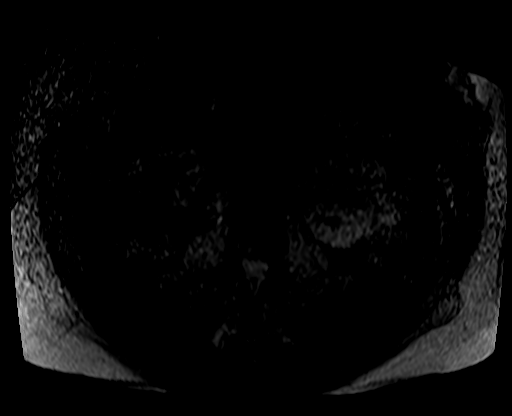

[Series 12: post 25 sec · axial · 2.0mm · 0.70mm/px · z∈[+8,+198]mm · 4 of 96 slices shown]
[im 1/96]
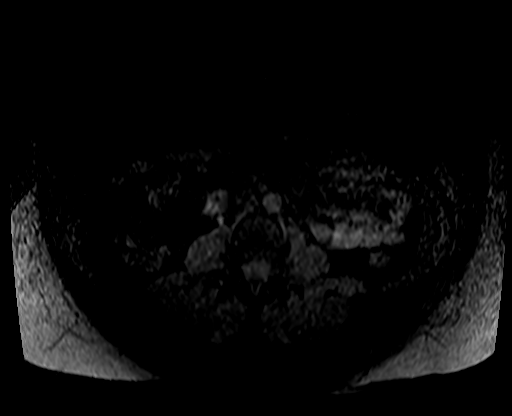
[im 32/96]
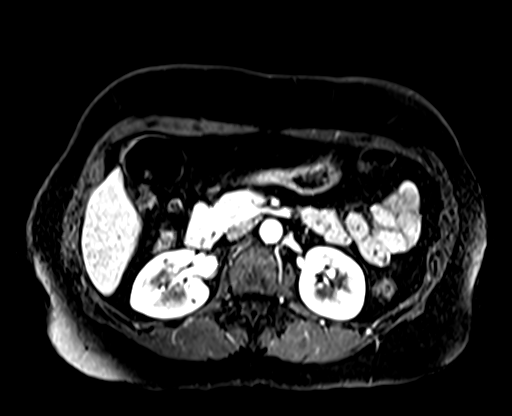
[im 64/96]
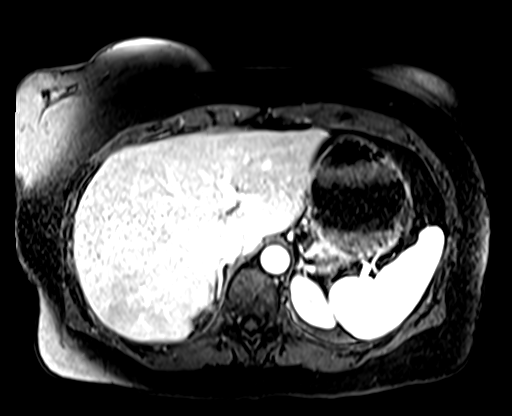
[im 96/96]
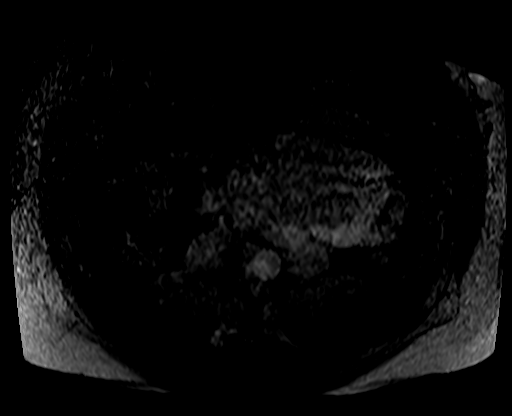

[Series 13: post 25 sec_sub · axial · 2.0mm · 0.70mm/px · z∈[+8,+198]mm · 4 of 96 slices shown]
[im 1/96]
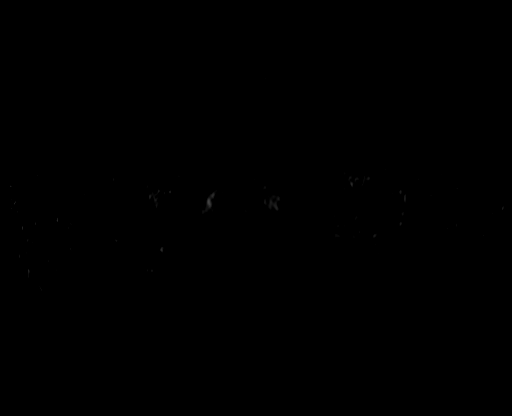
[im 32/96]
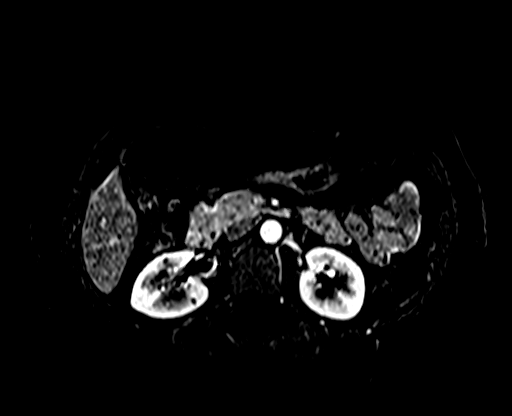
[im 64/96]
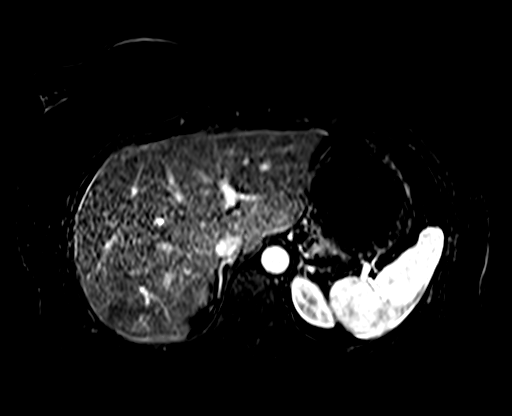
[im 96/96]
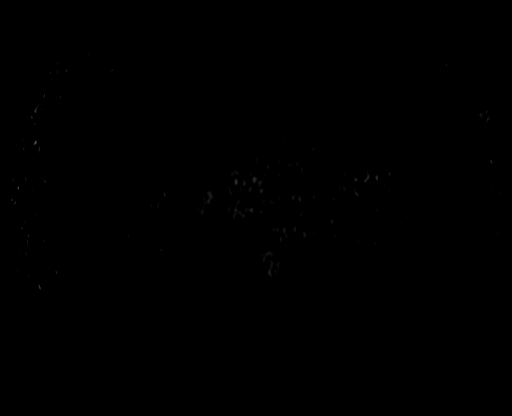

[Series 14: post 45 sec · axial · 2.0mm · 0.70mm/px · z∈[+8,+198]mm · 4 of 96 slices shown]
[im 1/96]
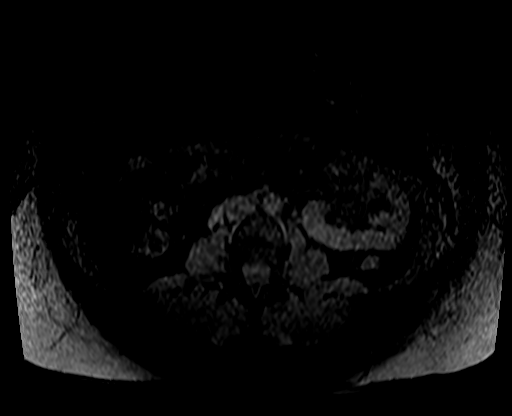
[im 32/96]
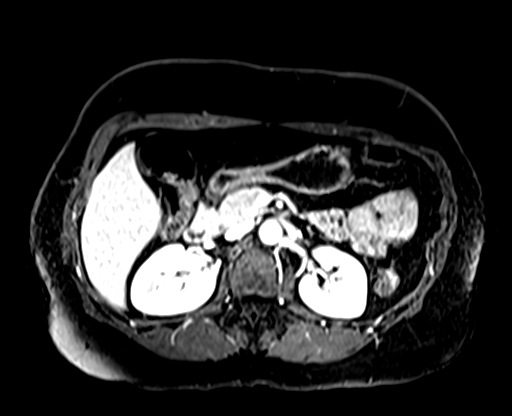
[im 64/96]
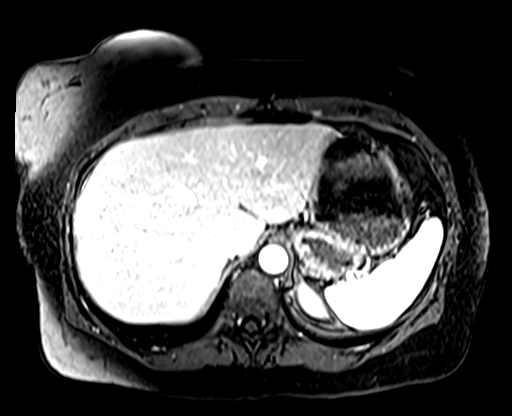
[im 96/96]
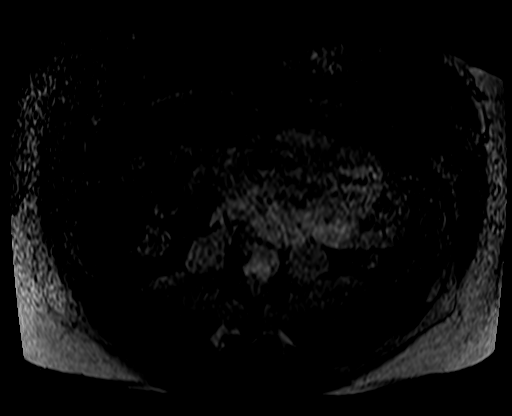

[Series 15: post 45 sec_sub · axial · 2.0mm · 0.70mm/px · z∈[+8,+134]mm · 3 of 96 slices shown]
[im 1/96]
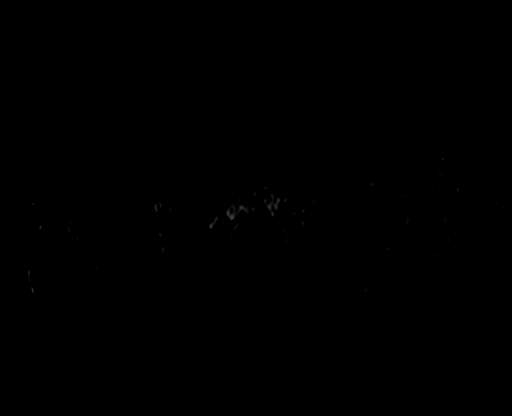
[im 32/96]
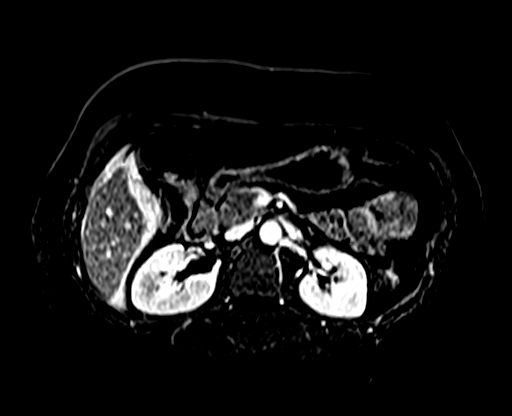
[im 64/96]
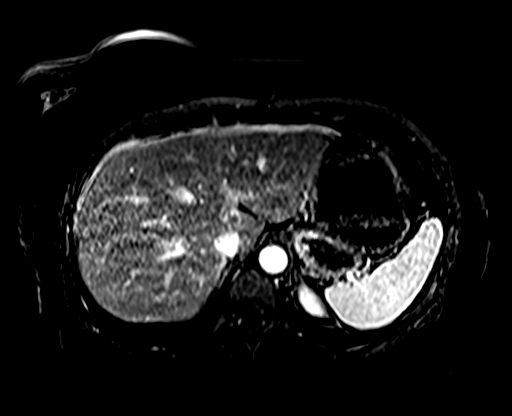

[29 of 48 positions shown; findings below may reference images not displayed]

FINDINGS: Lower chest: No acute findings.

Hepatobiliary: No hepatic masses identified. No evidence of
restricted diffusion. No evidence hepatic steatosis. Gallbladder is
unremarkable. No evidence of biliary ductal dilatation.

Pancreas:  No mass or inflammatory changes.

Spleen:  Within normal limits in size and appearance.

Adrenals/Urinary Tract: No masses identified. No evidence of
hydronephrosis.

Stomach/Bowel: Visualized portions within the abdomen are
unremarkable.

Vascular/Lymphatic: No pathologically enlarged lymph nodes
identified. No abdominal aortic aneurysm.

Other:  None.

Musculoskeletal:  No suspicious bone lesions identified.
IMPRESSION: Negative. No evidence of hepatic neoplasm or other significant
abnormality.

## 2017-09-16 DIAGNOSIS — E782 Mixed hyperlipidemia: Secondary | ICD-10-CM | POA: Diagnosis not present

## 2017-09-17 DIAGNOSIS — K210 Gastro-esophageal reflux disease with esophagitis: Secondary | ICD-10-CM | POA: Diagnosis not present

## 2017-09-17 DIAGNOSIS — K317 Polyp of stomach and duodenum: Secondary | ICD-10-CM | POA: Diagnosis not present

## 2017-09-17 DIAGNOSIS — K219 Gastro-esophageal reflux disease without esophagitis: Secondary | ICD-10-CM | POA: Diagnosis not present

## 2017-09-30 ENCOUNTER — Telehealth: Payer: Self-pay | Admitting: Diagnostic Neuroimaging

## 2017-09-30 ENCOUNTER — Other Ambulatory Visit: Payer: Self-pay | Admitting: Diagnostic Neuroimaging

## 2017-09-30 NOTE — Telephone Encounter (Signed)
Pt calling for a refill of SUMAtriptan (IMITREX) 50 MG tablet, please send to  [place], [place] [phone] (Phone) [phone] (Fax)

## 2017-09-30 NOTE — Telephone Encounter (Signed)
Refill was sent to the pharmacy

## 2017-10-06 DIAGNOSIS — K649 Unspecified hemorrhoids: Secondary | ICD-10-CM | POA: Diagnosis not present

## 2017-10-06 DIAGNOSIS — K5909 Other constipation: Secondary | ICD-10-CM | POA: Diagnosis not present

## 2017-10-06 DIAGNOSIS — R748 Abnormal levels of other serum enzymes: Secondary | ICD-10-CM | POA: Diagnosis not present

## 2017-10-06 DIAGNOSIS — Q393 Congenital stenosis and stricture of esophagus: Secondary | ICD-10-CM | POA: Diagnosis not present

## 2017-10-06 DIAGNOSIS — R946 Abnormal results of thyroid function studies: Secondary | ICD-10-CM | POA: Diagnosis not present

## 2017-10-19 DIAGNOSIS — M546 Pain in thoracic spine: Secondary | ICD-10-CM | POA: Diagnosis not present

## 2017-11-26 DIAGNOSIS — E782 Mixed hyperlipidemia: Secondary | ICD-10-CM | POA: Diagnosis not present

## 2017-12-02 ENCOUNTER — Ambulatory Visit: Payer: BLUE CROSS/BLUE SHIELD | Admitting: Diagnostic Neuroimaging

## 2017-12-02 ENCOUNTER — Encounter: Payer: Self-pay | Admitting: Diagnostic Neuroimaging

## 2017-12-02 VITALS — BP 128/85 | HR 69 | Ht 63.0 in | Wt 162.6 lb

## 2017-12-02 DIAGNOSIS — G43109 Migraine with aura, not intractable, without status migrainosus: Secondary | ICD-10-CM | POA: Diagnosis not present

## 2017-12-02 MED ORDER — SUMATRIPTAN SUCCINATE 50 MG PO TABS: ORAL_TABLET | ORAL | 12 refills | Status: DC

## 2017-12-02 NOTE — Progress Notes (Signed)
[facility]  PATIENT: Megan Castillo DOB: 03-06-1966  REFERRING CLINICIAN:  HISTORY FROM: patient  REASON FOR VISIT: follow up    HISTORICAL  CHIEF COMPLAINT:  Chief Complaint  Patient presents with  . Migraine    Patient reports that her migraines had become more frequent, however, she has not had a bad one in the last month.     HISTORY OF PRESENT ILLNESS:   UPDATE (12/02/17, [facility]P): Since last visit, doing well for several months; 1 HA every 2-3 months. Then in Oct 2018 had 1 HA per week. Tolerating sumatriptan helps. No new alleviating or aggravating factors.   UPDATE 08/05/16: Since last visit, doing well with headaches (1 every month or other month). Sumatriptan 50mg  daily well. Still with high levels of stress (husband just d/c'd from hospital today for MI).   PRIOR HPI (03/03/16): 51 year old female here for evaluation of headaches. For past 3-4 years patient has had onset of unilateral, sometimes bilateral, periorbital severe intense headache and pain. Patient typically lays down, turns down the lights, uses warm washcloth to help relieve symptoms. Patient feels tired and foggy after headaches. Sometimes she has decreased peripheral vision and scotoma during the headaches. Patient has some sensitivity to light. No nausea or vomiting. She has some dizziness. She has some sinus pressure. Patient thought she had sinus headaches for many years and went to ENT recently for evaluation. Patient was told she may have migraine headaches and referred to me for further evaluation. Patient having approximately 2-4 days of migraine headache per month. Triggering factors include change of weather, change of season, decreased sleep. Patient tried sumatriptan in the past with mild relief.   REVIEW OF SYSTEMS: Full 14 system review of systems performed and negative with exception of: headache light sens snoring.    ALLERGIES: No Known Allergies  HOME  MEDICATIONS: Outpatient Medications Prior to Visit  Medication Sig Dispense Refill  . rosuvastatin (CRESTOR) 10 MG tablet Take 10 mg by mouth daily.    . SUMAtriptan (IMITREX) 50 MG tablet take 1 tablet by mouth if needed for migraines MAY REPEAT 1 8 tablet 3  . Naproxen Sodium (ALEVE) 220 MG CAPS Take 220 mg by mouth every 8 (eight) hours as needed. Reported on 03/03/2016     No facility-administered medications prior to visit.     PAST MEDICAL HISTORY: Past Medical History:  Diagnosis Date  . Arthritis   . Constipation   . Hemorrhoids   . Rectal pain     PAST SURGICAL HISTORY: Past Surgical History:  Procedure Laterality Date  . ABDOMINAL HYSTERECTOMY  11/2009  . HEMORRHOID SURGERY      FAMILY HISTORY: Family History  Problem Relation Age of Onset  . Heart disease Mother   . Cancer Brother 64       Burkett lymphoma    SOCIAL HISTORY:  Social History   Socioeconomic History  . Marital status: Married    Spouse name: Elta Guadeloupe  . Number of children: 2  . Years of education: 11 1/2  . Highest education level: Not on file  Social Needs  . Financial resource strain: Not on file  . Food insecurity - worry: Not on file  . Food insecurity - inability: Not on file  . Transportation needs - medical: Not on file  . Transportation needs - non-medical: Not on file  Occupational History    Comment: Journalist, newspaper  Tobacco Use  . Smoking status: Never Smoker  . Smokeless tobacco: Never  Used  Substance and Sexual Activity  . Alcohol use: No  . Drug use: No  . Sexual activity: Yes  Other Topics Concern  . Not on file  Social History Narrative   Lives at home with husband   Caffeine use-tea twice a day     PHYSICAL EXAM  GENERAL EXAM/CONSTITUTIONAL: Vitals:  Vitals:   12/02/17 1524  BP: 128/85  Pulse: 69  Weight: 162 lb 9.6 oz (73.8 kg)  Height: 5\' 3"  (1.6 m)   Body mass index is 28.8 kg/m. No exam data present  Patient is in no distress; well  developed, nourished and groomed; neck is supple  CARDIOVASCULAR:  Examination of carotid arteries is normal; no carotid bruits  Regular rate and rhythm, no murmurs  Examination of peripheral vascular system by observation and palpation is normal  EYES:  Ophthalmoscopic exam of optic discs and posterior segments is normal; no papilledema or hemorrhages  MUSCULOSKELETAL:  Gait, strength, tone, movements noted in Neurologic exam below  NEUROLOGIC: MENTAL STATUS:  No flowsheet data found.  awake, alert, oriented to person, place and time  recent and remote memory intact  normal attention and concentration  language fluent, comprehension intact, naming intact,   fund of knowledge appropriate  CRANIAL NERVE:   2nd - no papilledema on fundoscopic exam  2nd, 3rd, 4th, 6th - pupils equal and reactive to light, visual fields full to confrontation, extraocular muscles intact, no nystagmus  5th - facial sensation symmetric  7th - facial strength symmetric  8th - hearing intact  9th - palate elevates symmetrically, uvula midline  11th - shoulder shrug symmetric  12th - tongue protrusion midline  MOTOR:   normal bulk and tone, full strength in the BUE, BLE  SENSORY:   normal and symmetric to light touch, temperature, vibration  COORDINATION:   finger-nose-finger, fine finger movements normal  REFLEXES:   deep tendon reflexes present and symmetric  GAIT/STATION:   narrow based gait; able to walk on tandem    DIAGNOSTIC DATA (LABS, IMAGING, TESTING) - I reviewed patient records, labs, notes, testing and imaging myself where available.  Lab Results  Component Value Date   WBC 7.8 12/05/2015   HGB 14.2 12/05/2015   HCT 43.8 12/05/2015   MCV 93.4 12/05/2015   PLT 241 12/05/2015      Component Value Date/Time   NA 140 12/05/2015 1217   K 4.1 12/05/2015 1217   CL 104 12/05/2015 1217   CO2 27 12/05/2015 1217   GLUCOSE 90 12/05/2015 1217   BUN 11  12/05/2015 1217   CREATININE 0.73 12/05/2015 1217   CREATININE 0.70 04/10/2013 1702   CALCIUM 9.6 12/05/2015 1217   PROT 7.4 04/10/2013 1702   ALBUMIN 4.5 04/10/2013 1702   AST 28 04/10/2013 1702   ALT 28 04/10/2013 1702   ALKPHOS 80 04/10/2013 1702   BILITOT 0.3 04/10/2013 1702   GFRNONAA >60 12/05/2015 1217   GFRAA >60 12/05/2015 1217   No results found for: CHOL, HDL, LDLCALC, LDLDIRECT, TRIG, CHOLHDL No results found for: HGBA1C No results found for: VITAMINB12 No results found for: TSH   02/09/13 MRI brain (with and without contrast) 1. Few punctate subcortical, periventricular and juxtacortical foci of gliosis. These findings are non-specific and considerations include autoimmune, inflammatory, post-infectious, microvascular ischemic or migraine associated etiologies.  2. No abnormal enhancing lesions.     ASSESSMENT AND PLAN  51 y.o. year old female here with 3-4 years of headaches with migraine features. Symptoms started around 2013. Neurologic  examination MRI of the brain are unremarkable. Reassured patient in terms of diagnosis and treatment options.   Doing well on sumatriptan 50mg  prn headache. Patient may take this with over-the-counter Tylenol or ibuprofen for relief. Advised on migraine treatment strategies, including early treatment, trigger management, and starting a headache diary.   Dx:  1. Migraine with aura and without status migrainosus, not intractable       PLAN:  - continue sumatriptan 50mg  as needed for breakthrough migraine - consider topiramate or propranolol in future for prevention if needed  Meds ordered this encounter  Medications  . SUMAtriptan (IMITREX) 50 MG tablet    Sig: take 1 tablet by mouth if needed for migraines MAY REPEAT 1    Dispense:  8 tablet    Refill:  12   Return in about 6 months (around 06/02/2018) for with NP Clabe Seal).    Penni Bombard, MD 89/84/2103, 1:28 PM Certified in Neurology, Neurophysiology and  Neuroimaging  [facility] [address] [place], [place] [address] [phone]

## 2017-12-07 DIAGNOSIS — E782 Mixed hyperlipidemia: Secondary | ICD-10-CM | POA: Diagnosis not present

## 2017-12-07 DIAGNOSIS — K210 Gastro-esophageal reflux disease with esophagitis: Secondary | ICD-10-CM | POA: Diagnosis not present

## 2017-12-30 DIAGNOSIS — Z1231 Encounter for screening mammogram for malignant neoplasm of breast: Secondary | ICD-10-CM | POA: Diagnosis not present

## 2018-01-21 DIAGNOSIS — D485 Neoplasm of uncertain behavior of skin: Secondary | ICD-10-CM | POA: Diagnosis not present

## 2018-02-09 DIAGNOSIS — D485 Neoplasm of uncertain behavior of skin: Secondary | ICD-10-CM | POA: Diagnosis not present

## 2018-06-02 ENCOUNTER — Ambulatory Visit: Payer: BLUE CROSS/BLUE SHIELD | Admitting: Adult Health

## 2018-06-07 ENCOUNTER — Ambulatory Visit: Payer: BLUE CROSS/BLUE SHIELD | Admitting: Adult Health

## 2018-08-16 DIAGNOSIS — F4322 Adjustment disorder with anxiety: Secondary | ICD-10-CM | POA: Diagnosis not present

## 2018-08-19 DIAGNOSIS — R1011 Right upper quadrant pain: Secondary | ICD-10-CM | POA: Diagnosis not present

## 2018-08-19 DIAGNOSIS — R748 Abnormal levels of other serum enzymes: Secondary | ICD-10-CM | POA: Diagnosis not present

## 2018-08-19 DIAGNOSIS — R5383 Other fatigue: Secondary | ICD-10-CM | POA: Diagnosis not present

## 2018-08-24 DIAGNOSIS — R1011 Right upper quadrant pain: Secondary | ICD-10-CM | POA: Diagnosis not present

## 2018-08-25 DIAGNOSIS — R748 Abnormal levels of other serum enzymes: Secondary | ICD-10-CM | POA: Diagnosis not present

## 2018-08-25 DIAGNOSIS — R1013 Epigastric pain: Secondary | ICD-10-CM | POA: Diagnosis not present

## 2018-08-25 DIAGNOSIS — R5383 Other fatigue: Secondary | ICD-10-CM | POA: Diagnosis not present

## 2018-08-25 DIAGNOSIS — M79645 Pain in left finger(s): Secondary | ICD-10-CM | POA: Diagnosis not present

## 2018-08-30 DIAGNOSIS — F4322 Adjustment disorder with anxiety: Secondary | ICD-10-CM | POA: Diagnosis not present

## 2018-09-02 DIAGNOSIS — N760 Acute vaginitis: Secondary | ICD-10-CM | POA: Diagnosis not present

## 2018-09-02 DIAGNOSIS — Z01419 Encounter for gynecological examination (general) (routine) without abnormal findings: Secondary | ICD-10-CM | POA: Diagnosis not present

## 2018-09-02 DIAGNOSIS — Z6827 Body mass index (BMI) 27.0-27.9, adult: Secondary | ICD-10-CM | POA: Diagnosis not present

## 2018-09-13 DIAGNOSIS — Z13820 Encounter for screening for osteoporosis: Secondary | ICD-10-CM | POA: Diagnosis not present

## 2018-09-28 DIAGNOSIS — R030 Elevated blood-pressure reading, without diagnosis of hypertension: Secondary | ICD-10-CM | POA: Diagnosis not present

## 2018-09-28 DIAGNOSIS — Z79899 Other long term (current) drug therapy: Secondary | ICD-10-CM | POA: Diagnosis not present

## 2018-09-28 DIAGNOSIS — Z807 Family history of other malignant neoplasms of lymphoid, hematopoietic and related tissues: Secondary | ICD-10-CM | POA: Diagnosis not present

## 2018-09-28 DIAGNOSIS — Z8719 Personal history of other diseases of the digestive system: Secondary | ICD-10-CM | POA: Diagnosis not present

## 2018-09-28 DIAGNOSIS — Z86010 Personal history of colonic polyps: Secondary | ICD-10-CM | POA: Diagnosis not present

## 2018-09-28 DIAGNOSIS — Z8669 Personal history of other diseases of the nervous system and sense organs: Secondary | ICD-10-CM | POA: Diagnosis not present

## 2018-09-28 DIAGNOSIS — R740 Nonspecific elevation of levels of transaminase and lactic acid dehydrogenase [LDH]: Secondary | ICD-10-CM | POA: Diagnosis not present

## 2018-09-28 DIAGNOSIS — R109 Unspecified abdominal pain: Secondary | ICD-10-CM | POA: Diagnosis not present

## 2018-09-28 DIAGNOSIS — M25471 Effusion, right ankle: Secondary | ICD-10-CM | POA: Diagnosis not present

## 2018-09-28 DIAGNOSIS — M25472 Effusion, left ankle: Secondary | ICD-10-CM | POA: Diagnosis not present

## 2018-10-04 ENCOUNTER — Encounter: Payer: Self-pay | Admitting: Diagnostic Neuroimaging

## 2018-10-04 ENCOUNTER — Ambulatory Visit: Payer: BLUE CROSS/BLUE SHIELD | Admitting: Diagnostic Neuroimaging

## 2018-10-04 VITALS — BP 141/89 | HR 67 | Ht 63.0 in | Wt 164.2 lb

## 2018-10-04 DIAGNOSIS — G43109 Migraine with aura, not intractable, without status migrainosus: Secondary | ICD-10-CM | POA: Diagnosis not present

## 2018-10-04 MED ORDER — SUMATRIPTAN SUCCINATE 100 MG PO TABS: ORAL_TABLET | ORAL | 12 refills | Status: DC

## 2018-10-04 NOTE — Progress Notes (Signed)
[facility]  PATIENT: Megan Castillo DOB: [birth year]  REFERRING CLINICIAN:  HISTORY FROM: patient  REASON FOR VISIT: follow up    HISTORICAL  CHIEF COMPLAINT:  Chief Complaint  Patient presents with  . Migraine    rm 6, " not having as many migraines, Sumatriptan helps"  . Follow-up    HISTORY OF PRESENT ILLNESS:   UPDATE (10/04/18, VRP): Since last visit, doing well. Symptoms are mild. Severity is mild. No alleviating or aggravating factors. Tolerating sumatriptan 50.  Avg 1 HA every 6 months.  UPDATE (12/02/17, VRP): Since last visit, doing well for several months; 1 HA every 2-3 months. Then in Oct 2018 had 1 HA per week. Tolerating sumatriptan helps. No new alleviating or aggravating factors.   UPDATE 08/05/16: Since last visit, doing well with headaches (1 every month or other month). Sumatriptan 50mg  daily well. Still with high levels of stress (husband just d/c'd from hospital today for MI).   PRIOR HPI (03/03/16): 52 year old female here for evaluation of headaches. For past 3-4 years patient has had onset of unilateral, sometimes bilateral, periorbital severe intense headache and pain. Patient typically lays down, turns down the lights, uses warm washcloth to help relieve symptoms. Patient feels tired and foggy after headaches. Sometimes she has decreased peripheral vision and scotoma during the headaches. Patient has some sensitivity to light. No nausea or vomiting. She has some dizziness. She has some sinus pressure. Patient thought she had sinus headaches for many years and went to ENT recently for evaluation. Patient was told she may have migraine headaches and referred to me for further evaluation. Patient having approximately 2-4 days of migraine headache per month. Triggering factors include change of weather, change of season, decreased sleep. Patient tried sumatriptan in the past with mild relief.   REVIEW OF SYSTEMS: Full 14 system review of  systems performed and negative with exception of: headache light sens snoring.    ALLERGIES: No Known Allergies  HOME MEDICATIONS: Outpatient Medications Prior to Visit  Medication Sig Dispense Refill  . calcium citrate-vitamin D (CITRACAL+D) 315-200 MG-UNIT tablet Take 1 tablet by mouth 2 (two) times daily.    . rosuvastatin (CRESTOR) 10 MG tablet Take 10 mg by mouth daily.    . SUMAtriptan (IMITREX) 50 MG tablet take 1 tablet by mouth if needed for migraines MAY REPEAT 1 8 tablet 12   No facility-administered medications prior to visit.     PAST MEDICAL HISTORY: Past Medical History:  Diagnosis Date  . Arthritis   . Constipation   . Hemorrhoids   . Migraine   . Rectal pain     PAST SURGICAL HISTORY: Past Surgical History:  Procedure Laterality Date  . ABDOMINAL HYSTERECTOMY  11/2009  . HEMORRHOID SURGERY      FAMILY HISTORY: Family History  Problem Relation Age of Onset  . Heart disease Mother   . Cancer Brother 68       Burkett lymphoma    SOCIAL HISTORY:  Social History   Socioeconomic History  . Marital status: Married    Spouse name: Elta [place]  . Number of children: 2  . Years of education: 35 1/2  . Highest education level: Not on file  Occupational History    Comment: Systems Engineering  Social Needs  . Financial resource strain: Not on file  . Food insecurity:    Worry: Not on file    Inability: Not on file  . Transportation needs:    Medical: Not on file  Non-medical: Not on file  Tobacco Use  . Smoking status: Never Smoker  . Smokeless tobacco: Never Used  Substance and Sexual Activity  . Alcohol use: No  . Drug use: No  . Sexual activity: Yes  Lifestyle  . Physical activity:    Days per week: Not on file    Minutes per session: Not on file  . Stress: Not on file  Relationships  . Social connections:    Talks on phone: Not on file    Gets together: Not on file    Attends religious service: Not on file    Active member of club or  organization: Not on file    Attends meetings of clubs or organizations: Not on file    Relationship status: Not on file  . Intimate partner violence:    Fear of current or ex partner: Not on file    Emotionally abused: Not on file    Physically abused: Not on file    Forced sexual activity: Not on file  Other Topics Concern  . Not on file  Social History Narrative   Lives at home with husband   Caffeine use-tea twice a day     PHYSICAL EXAM  GENERAL EXAM/CONSTITUTIONAL: Vitals:  Vitals:   10/04/18 1341  BP: (!) 141/89  Pulse: 67  Weight: 164 lb 3.2 oz (74.5 kg)  Height: 5\' 3"  (1.6 m)   Body mass index is 29.09 kg/m. No exam data present  Patient is in no distress; well developed, nourished and groomed; neck is supple  CARDIOVASCULAR:  Examination of carotid arteries is normal; no carotid bruits  Regular rate and rhythm, no murmurs  Examination of peripheral vascular system by observation and palpation is normal  EYES:  Ophthalmoscopic exam of optic discs and posterior segments is normal; no papilledema or hemorrhages  MUSCULOSKELETAL:  Gait, strength, tone, movements noted in Neurologic exam below  NEUROLOGIC: MENTAL STATUS:  No flowsheet data found.  awake, alert, oriented to person, place and time  recent and remote memory intact  normal attention and concentration  language fluent, comprehension intact, naming intact,   fund of knowledge appropriate  CRANIAL NERVE:   2nd - no papilledema on fundoscopic exam  2nd, 3rd, 4th, 6th - pupils equal and reactive to light, visual fields full to confrontation, extraocular muscles intact, no nystagmus  5th - facial sensation symmetric  7th - facial strength symmetric  8th - hearing intact  9th - palate elevates symmetrically, uvula midline  11th - shoulder shrug symmetric  12th - tongue protrusion midline  MOTOR:   normal bulk and tone, full strength in the BUE, BLE  SENSORY:   normal  and symmetric to light touch  COORDINATION:   finger-nose-finger, fine finger movements normal  REFLEXES:   deep tendon reflexes present and symmetric  GAIT/STATION:   narrow based gait    DIAGNOSTIC DATA (LABS, IMAGING, TESTING) - I reviewed patient records, labs, notes, testing and imaging myself where available.  Lab Results  Component Value Date   WBC 7.8 12/05/2015   HGB 14.2 12/05/2015   HCT 43.8 12/05/2015   MCV 93.4 12/05/2015   PLT 241 12/05/2015      Component Value Date/Time   NA 140 12/05/2015 1217   K 4.1 12/05/2015 1217   CL 104 12/05/2015 1217   CO2 27 12/05/2015 1217   GLUCOSE 90 12/05/2015 1217   BUN 11 12/05/2015 1217   CREATININE 0.73 12/05/2015 1217   CREATININE 0.70 04/10/2013 1702  CALCIUM 9.6 12/05/2015 1217   PROT 7.4 04/10/2013 1702   ALBUMIN 4.5 04/10/2013 1702   AST 28 04/10/2013 1702   ALT 28 04/10/2013 1702   ALKPHOS 80 04/10/2013 1702   BILITOT 0.3 04/10/2013 1702   GFRNONAA >60 12/05/2015 1217   GFRAA >60 12/05/2015 1217   No results found for: CHOL, HDL, LDLCALC, LDLDIRECT, TRIG, CHOLHDL No results found for: HGBA1C No results found for: VITAMINB12 No results found for: TSH   02/09/13 MRI brain (with and without contrast) 1. Few punctate subcortical, periventricular and juxtacortical foci of gliosis. These findings are non-specific and considerations include autoimmune, inflammatory, post-infectious, microvascular ischemic or migraine associated etiologies.  2. No abnormal enhancing lesions.     ASSESSMENT AND PLAN  52 y.o. year old female here with 3-4 years of headaches with migraine features. Symptoms started around 2013. Neurologic examination MRI of the brain are unremarkable. Reassured patient in terms of diagnosis and treatment options.   Doing well on sumatriptan 50mg  prn headache, but sometimes doesn't work quite as well as before. Patient may take this with over-the-counter Tylenol or ibuprofen for relief.  Advised on migraine treatment strategies, including early treatment, trigger management, and starting a headache diary.  Dx:  1. Migraine with aura and without status migrainosus, not intractable     PLAN:  - increase to sumatriptan 100mg  as needed for breakthrough migraine - may consider topiramate or propranolol in future for prevention if needed  Meds ordered this encounter  Medications  . SUMAtriptan (IMITREX) 100 MG tablet    Sig: take 1 tablet by mouth if needed for migraines; MAY REPEAT 1 AFTER 2 HOURS; 2 PER DAY OR 8 PER MONTH    Dispense:  8 tablet    Refill:  12   Return in about 1 year (around 10/05/2019).    Penni Bombard, MD 97/35/3299, 2:42 PM Certified in Neurology, Neurophysiology and Neuroimaging  [facility] [address], [place], [place] [address] [phone]

## 2018-10-14 ENCOUNTER — Ambulatory Visit: Payer: BLUE CROSS/BLUE SHIELD | Admitting: Adult Health

## 2018-10-26 DIAGNOSIS — Z1211 Encounter for screening for malignant neoplasm of colon: Secondary | ICD-10-CM | POA: Diagnosis not present

## 2018-10-26 DIAGNOSIS — Z0000 Encounter for general adult medical examination without abnormal findings: Secondary | ICD-10-CM | POA: Diagnosis not present

## 2018-10-26 DIAGNOSIS — S6992XA Unspecified injury of left wrist, hand and finger(s), initial encounter: Secondary | ICD-10-CM | POA: Diagnosis not present

## 2018-10-26 DIAGNOSIS — R1013 Epigastric pain: Secondary | ICD-10-CM | POA: Diagnosis not present

## 2018-11-01 ENCOUNTER — Emergency Department (HOSPITAL_COMMUNITY): Payer: BLUE CROSS/BLUE SHIELD

## 2018-11-01 ENCOUNTER — Emergency Department (HOSPITAL_COMMUNITY)
Admission: EM | Admit: 2018-11-01 | Discharge: 2018-11-01 | Discharge status: Against Medical Advice | Payer: BLUE CROSS/BLUE SHIELD

## 2018-11-01 ENCOUNTER — Other Ambulatory Visit: Payer: Self-pay

## 2018-11-01 ENCOUNTER — Emergency Department (HOSPITAL_COMMUNITY)
Admission: EM | Admit: 2018-11-01 | Discharge: 2018-11-01 | Disposition: A | Discharge status: Home / Self Care | Payer: BLUE CROSS/BLUE SHIELD | Attending: Emergency Medicine | Admitting: Emergency Medicine

## 2018-11-01 ENCOUNTER — Encounter (HOSPITAL_COMMUNITY): Payer: Self-pay

## 2018-11-01 DIAGNOSIS — N3000 Acute cystitis without hematuria: Secondary | ICD-10-CM | POA: Diagnosis not present

## 2018-11-01 DIAGNOSIS — R072 Precordial pain: Secondary | ICD-10-CM | POA: Diagnosis not present

## 2018-11-01 DIAGNOSIS — G44209 Tension-type headache, unspecified, not intractable: Secondary | ICD-10-CM | POA: Insufficient documentation

## 2018-11-01 DIAGNOSIS — I10 Essential (primary) hypertension: Secondary | ICD-10-CM | POA: Diagnosis not present

## 2018-11-01 DIAGNOSIS — R000 Tachycardia, unspecified: Secondary | ICD-10-CM | POA: Insufficient documentation

## 2018-11-01 DIAGNOSIS — R079 Chest pain, unspecified: Secondary | ICD-10-CM | POA: Diagnosis not present

## 2018-11-01 DIAGNOSIS — R29818 Other symptoms and signs involving the nervous system: Secondary | ICD-10-CM | POA: Diagnosis not present

## 2018-11-01 LAB — BASIC METABOLIC PANEL
Anion gap: 13 (ref 5–15)
BUN: 10 mg/dL (ref 6–20)
CO2: 21 mmol/L — ABNORMAL LOW (ref 22–32)
Calcium: 9.2 mg/dL (ref 8.9–10.3)
Chloride: 107 mmol/L (ref 98–111)
Creatinine, Ser: 0.83 mg/dL (ref 0.44–1.00)
GFR calc Af Amer: >60 mL/min (ref >60)
GFR calc non Af Amer: >60 mL/min (ref >60)
Glucose, Bld: 93 mg/dL (ref 70–99)
Potassium: 3.5 mmol/L (ref 3.5–5.1)
Sodium: 141 mmol/L (ref 135–145)

## 2018-11-01 LAB — URINALYSIS, ROUTINE W REFLEX MICROSCOPIC
Bilirubin Urine: NEGATIVE
Glucose, UA: NEGATIVE mg/dL
Ketones, ur: NEGATIVE mg/dL
Nitrite: NEGATIVE
Protein, ur: NEGATIVE mg/dL
Specific Gravity, Urine: 1.014 (ref 1.005–1.030)
WBC, UA: >50 WBC/hpf — ABNORMAL HIGH (ref 0–5)
pH: 6 (ref 5.0–8.0)

## 2018-11-01 LAB — CBC
HCT: 44.7 % (ref 36.0–46.0)
Hemoglobin: 14 g/dL (ref 12.0–15.0)
MCH: 28.9 pg (ref 26.0–34.0)
MCHC: 31.3 g/dL (ref 30.0–36.0)
MCV: 92.2 fL (ref 80.0–100.0)
Platelets: 214 10*3/uL (ref 150–400)
RBC: 4.85 MIL/uL (ref 3.87–5.11)
RDW: 13.6 % (ref 11.5–15.5)
WBC: 8.3 10*3/uL (ref 4.0–10.5)
nRBC: 0 % (ref 0.0–0.2)

## 2018-11-01 LAB — CBG MONITORING, ED: Glucose-Capillary: 92 mg/dL (ref 70–99)

## 2018-11-01 LAB — I-STAT BETA HCG BLOOD, ED (MC, WL, AP ONLY): I-stat hCG, quantitative: 6.3 m[IU]/mL — ABNORMAL HIGH (ref <5)

## 2018-11-01 LAB — D-DIMER, QUANTITATIVE: D-Dimer, Quant: 0.29 ug/mL-FEU (ref 0.00–0.50)

## 2018-11-01 LAB — TROPONIN I: Troponin I: <0.03 ng/mL (ref <0.03)

## 2018-11-01 MED ORDER — CEPHALEXIN 500 MG PO CAPS: 500.0000 mg | ORAL_CAPSULE | Freq: Three times a day (TID) | ORAL | 0 refills | Status: AC

## 2018-11-01 MED ORDER — ACETAMINOPHEN 500 MG PO TABS
1000.0000 mg | ORAL_TABLET | Freq: Once | ORAL | Status: AC
  Administered 2018-11-01: 1000 mg via ORAL
  Filled 2018-11-01: qty 2

## 2018-11-01 MED ORDER — CEPHALEXIN 250 MG PO CAPS
500.0000 mg | ORAL_CAPSULE | Freq: Once | ORAL | Status: AC
  Administered 2018-11-01: 500 mg via ORAL
  Filled 2018-11-01: qty 2

## 2018-11-01 MED ORDER — HYDROCHLOROTHIAZIDE 12.5 MG PO TABS: 12.5000 mg | ORAL_TABLET | Freq: Every day | ORAL | 0 refills | Status: DC

## 2018-11-01 MED ORDER — HYDROCHLOROTHIAZIDE 12.5 MG PO CAPS
12.5000 mg | ORAL_CAPSULE | Freq: Every day | ORAL | Status: DC
  Administered 2018-11-01: 12.5 mg via ORAL
  Filled 2018-11-01: qty 1

## 2018-11-01 MED ORDER — MECLIZINE HCL 25 MG PO TABS
25.0000 mg | ORAL_TABLET | Freq: Once | ORAL | Status: AC
  Administered 2018-11-01: 25 mg via ORAL
  Filled 2018-11-01: qty 1

## 2018-11-01 NOTE — ED Triage Notes (Signed)
Pt here from home for evaluation of left arm tingling, hypertension, and ataxia.  Symptoms started yesterday and went into today and have not resolved.  Pt A&Ox4 with global weakness.  No focal neuro deficits noted.

## 2018-11-01 NOTE — ED Notes (Signed)
Pt CBG 92.  

## 2018-11-01 NOTE — ED Notes (Signed)
Patient Alert and oriented to baseline. Stable and ambulatory to baseline. Patient verbalized understanding of the discharge instructions.  Patient belongings were taken by the patient.   

## 2018-11-01 NOTE — ED Provider Notes (Signed)
Emergency Department Provider Note   I have reviewed the triage vital signs and the nursing notes.   HISTORY  Chief Complaint Hypertension; Dizziness; and Tingling   HPI Megan Castillo is a 52 y.o. female with PMH of arthritis and migraine HApresents to the emergency department with elevated blood pressures at home along with lightheadedness with slight vertigo symptoms.  Patient states she is also had some posterior left chest/back discomfort.  No anterior chest pain.  The modifying factors for this discomfort.  No fevers or chills.  No neck pain or stiffness.  Patient noticed today that her blood pressures were significantly elevated from her baseline.  She states that in terms of her blood pressure she is been to her PCP for checkups and had systolic blood pressure as high as 145 but today was in the 190s.  The vertigo/dizziness symptoms are mild but persistent.  She is developing a gradual onset, diffuse headache.  No sudden onset, maximal intensity headache symptoms.  No blurry vision or double vision.  No vomiting.  Past Medical History:  Diagnosis Date  . Arthritis   . Constipation   . Hemorrhoids   . Migraine   . Rectal pain     Patient Active Problem List   Diagnosis Date Noted  . Hemorrhoids, internal, with bleeding 12/22/2011    Past Surgical History:  Procedure Laterality Date  . ABDOMINAL HYSTERECTOMY  11/2009  . HEMORRHOID SURGERY      Allergies Patient has no known allergies.  Family History  Problem Relation Age of Onset  . Heart disease Mother   . Cancer Brother 16       Burkett lymphoma    Social History Social History   Tobacco Use  . Smoking status: Never Smoker  . Smokeless tobacco: Never Used  Substance Use Topics  . Alcohol use: No  . Drug use: No    Review of Systems  Constitutional: No fever/chills Eyes: No visual changes. ENT: No sore throat. Cardiovascular: Positive posterior chest wall/back pain with elevated BP.   Respiratory: Denies shortness of breath. Gastrointestinal: No abdominal pain.  No nausea, no vomiting.  No diarrhea.  No constipation. Genitourinary: Negative for dysuria. Musculoskeletal: Negative for back pain. Skin: Negative for rash. Neurological: Negative for focal weakness or numbness. Positive HA.   10-point ROS otherwise negative.  ____________________________________________   PHYSICAL EXAM:  VITAL SIGNS: ED Triage Vitals  Enc Vitals Group     BP 11/01/18 1827 (!) 183/108     Pulse Rate 11/01/18 1827 (!) 119     Resp 11/01/18 1827 18     Temp 11/01/18 1827 98.4 F (36.9 C)     Temp Source 11/01/18 1827 Oral     SpO2 11/01/18 1827 100 %     Pain Score 11/01/18 1829 0    Constitutional: Alert and oriented. Well appearing and in no acute distress. Eyes: Conjunctivae are normal. PERRL. EOMI. Head: Atraumatic. Nose: No congestion/rhinnorhea. Mouth/Throat: Mucous membranes are moist.  Oropharynx non-erythematous. Neck: No stridor.  Cardiovascular: Tachycardia. Good peripheral circulation. Grossly normal heart sounds.   Respiratory: Normal respiratory effort.  No retractions. Lungs CTAB. Gastrointestinal: Soft and nontender. No distention.  Musculoskeletal: No lower extremity tenderness nor edema. No gross deformities of extremities. Neurologic:  Normal speech and language. No gross focal neurologic deficits are appreciated. Normal CN exam 2-12. Normal finger-to-nose testing. No pronator drift.  Skin:  Skin is warm, dry and intact. No rash noted.  ____________________________________________   LABS (all labs ordered  are listed, but only abnormal results are displayed)  Labs Reviewed  BASIC METABOLIC PANEL - Abnormal; Notable for the following components:      Result Value   CO2 21 (*)    All other components within normal limits  URINALYSIS, ROUTINE W REFLEX MICROSCOPIC - Abnormal; Notable for the following components:   APPearance CLOUDY (*)    Hgb urine  dipstick SMALL (*)    Leukocytes, UA LARGE (*)    WBC, UA >50 (*)    Bacteria, UA RARE (*)    Non Squamous Epithelial 0-5 (*)    All other components within normal limits  I-STAT BETA HCG BLOOD, ED ([facility], [facility], [facility] ONLY) - Abnormal; Notable for the following components:   I-stat hCG, quantitative 6.3 (*)    All other components within normal limits  CBC  TROPONIN I  D-DIMER, QUANTITATIVE (NOT AT [facility])  CBG MONITORING, ED   ____________________________________________  EKG   EKG Interpretation  Date/Time:  Monday November 01 2018 18:25:39 EST Ventricular Rate:  113 PR Interval:    QRS Duration: 101 QT Interval:  333 QTC Calculation: 457 R Axis:   9 Text Interpretation:  Sinus tachycardia No STEMI  Confirmed by Nanda Quinton [id number]) on 11/01/2018 7:22:51 PM Also confirmed by Nanda Quinton [phone]), editor Hattie Perch ([id number])  on 11/02/2018 7:07:03 AM       ____________________________________________  RADIOLOGY  Dg Chest 2 View  Result Date: 11/01/2018 CLINICAL DATA:  Chest pain EXAM: CHEST - 2 VIEW COMPARISON:  12/05/2015 FINDINGS: The heart size and mediastinal contours are within normal limits. Both lungs are clear. The visualized skeletal structures are unremarkable. IMPRESSION: No active cardiopulmonary disease. Electronically Signed   By: Donavan Foil M.D.   On: 11/01/2018 21:53   Ct Head Wo Contrast  Result Date: 11/01/2018 CLINICAL DATA:  Focal neurologic deficit. Left arm tingling. Hypertension. Ataxia. EXAM: CT HEAD WITHOUT CONTRAST TECHNIQUE: Contiguous axial images were obtained from the base of the skull through the vertex without intravenous contrast. COMPARISON:  None. FINDINGS: Brain: The brainstem, cerebellum, cerebral peduncles, thalami, basal ganglia, basilar cisterns, and ventricular system appear within normal limits. No intracranial hemorrhage, mass lesion, or acute CVA. Vascular: Unremarkable Skull: Unremarkable Sinuses/Orbits: Unremarkable Other: No  supplemental non-categorized findings. IMPRESSION: 1. No significant intracranial findings to explain the patient's symptoms. Electronically Signed   By: Van Clines M.D.   On: 11/01/2018 19:56    ____________________________________________   PROCEDURES  Procedure(s) performed:   Procedures  None ____________________________________________   INITIAL IMPRESSION / ASSESSMENT AND PLAN / ED COURSE  Pertinent labs & imaging results that were available during my care of the patient were reviewed by me and considered in my medical decision making (see chart for details).  Patient presents to the emergency department for evaluation of multiple symptoms including headache, posterior chest wall pain, headedness/vertigo.  Low suspicion for hypertensive emergency but patient's blood pressure is significantly elevated here.  She has associated tachycardia.  Will need screening labs to assess for endorgan damage.  Given her subjective vertigo symptoms with significantly elevated blood pressure I have decided to obtain a CT scan of the head without contrast.  Chest x-ray given her atypical, posterior chest wall pain.  Unable to [facility] given age and HR so D-dimer sent as well.  Labs including d-dimer and troponin are negative. CT head and CXR unremarkable. Patient BP improved here since arrival. Plan for low-dose HCTZ and close PCP follow up. No evidence to suggest HTN emergency. Patient also  with UTI on UA. During discussion, patient has had some mild UTI symptoms without fever or back pain. No clinical concern for pyelonephritis. Plan for Keflex.  ____________________________________________  FINAL CLINICAL IMPRESSION(S) / ED DIAGNOSES  Final diagnoses:  Precordial chest pain  Essential hypertension  Acute non intractable tension-type headache  Acute cystitis without hematuria     MEDICATIONS GIVEN DURING THIS VISIT:  Medications  acetaminophen (TYLENOL) tablet 1,000 mg (1,000 mg Oral  Given 11/01/18 1936)  meclizine (ANTIVERT) tablet 25 mg (25 mg Oral Given 11/01/18 1936)  cephALEXin (KEFLEX) capsule 500 mg (500 mg Oral Given 11/01/18 2221)     NEW OUTPATIENT MEDICATIONS STARTED DURING THIS VISIT:  Discharge Medication List as of 11/01/2018 10:04 PM    START taking these medications   Details  cephALEXin (KEFLEX) 500 MG capsule Take 1 capsule (500 mg total) by mouth 3 (three) times daily for 7 days., Starting Mon 11/01/2018, Until Mon 11/08/2018, Print    hydrochlorothiazide (HYDRODIURIL) 12.5 MG tablet Take 1 tablet (12.5 mg total) by mouth daily., Starting Mon 11/01/2018, Until Wed 12/01/2018, Print        Note:  This document was prepared using Dragon voice recognition software and may include unintentional dictation errors.  Nanda Quinton, MD Emergency Medicine    Manvir Thorson, [facility], MD 11/02/18 [phone]

## 2018-11-01 NOTE — Discharge Instructions (Signed)

## 2018-11-02 DIAGNOSIS — Z808 Family history of malignant neoplasm of other organs or systems: Secondary | ICD-10-CM | POA: Diagnosis not present

## 2018-11-02 DIAGNOSIS — L821 Other seborrheic keratosis: Secondary | ICD-10-CM | POA: Diagnosis not present

## 2018-11-02 DIAGNOSIS — D2339 Other benign neoplasm of skin of other parts of face: Secondary | ICD-10-CM | POA: Diagnosis not present

## 2018-11-02 DIAGNOSIS — L918 Other hypertrophic disorders of the skin: Secondary | ICD-10-CM | POA: Diagnosis not present

## 2018-11-10 DIAGNOSIS — F4323 Adjustment disorder with mixed anxiety and depressed mood: Secondary | ICD-10-CM | POA: Diagnosis not present

## 2018-11-10 DIAGNOSIS — I10 Essential (primary) hypertension: Secondary | ICD-10-CM | POA: Diagnosis not present

## 2018-11-19 DIAGNOSIS — S60022A Contusion of left index finger without damage to nail, initial encounter: Secondary | ICD-10-CM | POA: Diagnosis not present

## 2018-11-19 DIAGNOSIS — M7711 Lateral epicondylitis, right elbow: Secondary | ICD-10-CM | POA: Diagnosis not present

## 2018-11-19 DIAGNOSIS — M79645 Pain in left finger(s): Secondary | ICD-10-CM | POA: Diagnosis not present

## 2018-12-07 DIAGNOSIS — M546 Pain in thoracic spine: Secondary | ICD-10-CM | POA: Diagnosis not present

## 2018-12-07 DIAGNOSIS — M419 Scoliosis, unspecified: Secondary | ICD-10-CM | POA: Insufficient documentation

## 2018-12-07 DIAGNOSIS — M5134 Other intervertebral disc degeneration, thoracic region: Secondary | ICD-10-CM | POA: Diagnosis not present

## 2018-12-09 DIAGNOSIS — F4323 Adjustment disorder with mixed anxiety and depressed mood: Secondary | ICD-10-CM | POA: Diagnosis not present

## 2018-12-09 DIAGNOSIS — G8929 Other chronic pain: Secondary | ICD-10-CM | POA: Diagnosis not present

## 2018-12-09 DIAGNOSIS — I10 Essential (primary) hypertension: Secondary | ICD-10-CM | POA: Diagnosis not present

## 2018-12-09 DIAGNOSIS — M546 Pain in thoracic spine: Secondary | ICD-10-CM | POA: Diagnosis not present

## 2018-12-16 DIAGNOSIS — J209 Acute bronchitis, unspecified: Secondary | ICD-10-CM | POA: Diagnosis not present

## 2018-12-16 DIAGNOSIS — R05 Cough: Secondary | ICD-10-CM | POA: Diagnosis not present

## 2018-12-22 DIAGNOSIS — M546 Pain in thoracic spine: Secondary | ICD-10-CM | POA: Diagnosis not present

## 2018-12-28 DIAGNOSIS — M546 Pain in thoracic spine: Secondary | ICD-10-CM | POA: Diagnosis not present

## 2018-12-28 DIAGNOSIS — M5124 Other intervertebral disc displacement, thoracic region: Secondary | ICD-10-CM | POA: Diagnosis not present

## 2019-01-10 DIAGNOSIS — Z1231 Encounter for screening mammogram for malignant neoplasm of breast: Secondary | ICD-10-CM | POA: Diagnosis not present

## 2019-01-12 DIAGNOSIS — R1013 Epigastric pain: Secondary | ICD-10-CM | POA: Diagnosis not present

## 2019-03-25 DIAGNOSIS — K219 Gastro-esophageal reflux disease without esophagitis: Secondary | ICD-10-CM | POA: Diagnosis not present

## 2019-03-25 DIAGNOSIS — R0789 Other chest pain: Secondary | ICD-10-CM | POA: Diagnosis not present

## 2019-03-25 DIAGNOSIS — R1012 Left upper quadrant pain: Secondary | ICD-10-CM | POA: Diagnosis not present

## 2019-04-05 DIAGNOSIS — R748 Abnormal levels of other serum enzymes: Secondary | ICD-10-CM | POA: Diagnosis not present

## 2019-04-05 DIAGNOSIS — R1013 Epigastric pain: Secondary | ICD-10-CM | POA: Diagnosis not present

## 2019-04-05 DIAGNOSIS — Z0000 Encounter for general adult medical examination without abnormal findings: Secondary | ICD-10-CM | POA: Diagnosis not present

## 2019-04-05 DIAGNOSIS — M2550 Pain in unspecified joint: Secondary | ICD-10-CM | POA: Diagnosis not present

## 2019-04-07 DIAGNOSIS — R1032 Left lower quadrant pain: Secondary | ICD-10-CM | POA: Diagnosis not present

## 2019-04-12 DIAGNOSIS — Z3201 Encounter for pregnancy test, result positive: Secondary | ICD-10-CM | POA: Diagnosis not present

## 2019-04-12 DIAGNOSIS — R748 Abnormal levels of other serum enzymes: Secondary | ICD-10-CM | POA: Diagnosis not present

## 2019-04-12 DIAGNOSIS — I10 Essential (primary) hypertension: Secondary | ICD-10-CM | POA: Diagnosis not present

## 2019-04-12 DIAGNOSIS — Z0000 Encounter for general adult medical examination without abnormal findings: Secondary | ICD-10-CM | POA: Diagnosis not present

## 2019-04-12 DIAGNOSIS — R1011 Right upper quadrant pain: Secondary | ICD-10-CM | POA: Diagnosis not present

## 2019-04-22 DIAGNOSIS — Z1159 Encounter for screening for other viral diseases: Secondary | ICD-10-CM | POA: Diagnosis not present

## 2019-04-26 DIAGNOSIS — E876 Hypokalemia: Secondary | ICD-10-CM | POA: Diagnosis not present

## 2019-04-26 DIAGNOSIS — I10 Essential (primary) hypertension: Secondary | ICD-10-CM | POA: Insufficient documentation

## 2019-04-26 DIAGNOSIS — R1012 Left upper quadrant pain: Secondary | ICD-10-CM | POA: Diagnosis not present

## 2019-04-26 DIAGNOSIS — R58 Hemorrhage, not elsewhere classified: Secondary | ICD-10-CM | POA: Diagnosis not present

## 2019-04-26 DIAGNOSIS — R1084 Generalized abdominal pain: Secondary | ICD-10-CM | POA: Diagnosis not present

## 2019-04-26 DIAGNOSIS — Z1211 Encounter for screening for malignant neoplasm of colon: Secondary | ICD-10-CM | POA: Diagnosis not present

## 2019-04-26 DIAGNOSIS — Z79899 Other long term (current) drug therapy: Secondary | ICD-10-CM | POA: Diagnosis not present

## 2019-04-26 DIAGNOSIS — K529 Noninfective gastroenteritis and colitis, unspecified: Secondary | ICD-10-CM | POA: Diagnosis not present

## 2019-04-26 DIAGNOSIS — R52 Pain, unspecified: Secondary | ICD-10-CM | POA: Diagnosis not present

## 2019-04-26 DIAGNOSIS — Z9889 Other specified postprocedural states: Secondary | ICD-10-CM | POA: Diagnosis not present

## 2019-04-26 DIAGNOSIS — K91840 Postprocedural hemorrhage and hematoma of a digestive system organ or structure following a digestive system procedure: Secondary | ICD-10-CM | POA: Diagnosis not present

## 2019-04-26 DIAGNOSIS — Z86010 Personal history of colonic polyps: Secondary | ICD-10-CM | POA: Diagnosis not present

## 2019-04-26 DIAGNOSIS — Z8719 Personal history of other diseases of the digestive system: Secondary | ICD-10-CM | POA: Diagnosis not present

## 2019-04-26 DIAGNOSIS — I959 Hypotension, unspecified: Secondary | ICD-10-CM | POA: Diagnosis not present

## 2019-04-26 DIAGNOSIS — D122 Benign neoplasm of ascending colon: Secondary | ICD-10-CM | POA: Diagnosis not present

## 2019-04-26 DIAGNOSIS — K633 Ulcer of intestine: Secondary | ICD-10-CM | POA: Diagnosis not present

## 2019-04-26 DIAGNOSIS — K625 Hemorrhage of anus and rectum: Secondary | ICD-10-CM | POA: Diagnosis not present

## 2019-04-26 DIAGNOSIS — K5730 Diverticulosis of large intestine without perforation or abscess without bleeding: Secondary | ICD-10-CM | POA: Diagnosis not present

## 2019-04-26 DIAGNOSIS — R197 Diarrhea, unspecified: Secondary | ICD-10-CM | POA: Diagnosis not present

## 2019-04-26 DIAGNOSIS — K219 Gastro-esophageal reflux disease without esophagitis: Secondary | ICD-10-CM | POA: Diagnosis not present

## 2019-04-26 DIAGNOSIS — K317 Polyp of stomach and duodenum: Secondary | ICD-10-CM | POA: Diagnosis not present

## 2019-04-26 DIAGNOSIS — R42 Dizziness and giddiness: Secondary | ICD-10-CM | POA: Diagnosis not present

## 2019-04-26 DIAGNOSIS — K921 Melena: Secondary | ICD-10-CM | POA: Diagnosis not present

## 2019-04-27 DIAGNOSIS — K91840 Postprocedural hemorrhage and hematoma of a digestive system organ or structure following a digestive system procedure: Secondary | ICD-10-CM | POA: Diagnosis not present

## 2019-04-27 DIAGNOSIS — K5730 Diverticulosis of large intestine without perforation or abscess without bleeding: Secondary | ICD-10-CM | POA: Diagnosis not present

## 2019-04-27 DIAGNOSIS — K625 Hemorrhage of anus and rectum: Secondary | ICD-10-CM | POA: Diagnosis not present

## 2019-04-27 DIAGNOSIS — K633 Ulcer of intestine: Secondary | ICD-10-CM | POA: Diagnosis not present

## 2019-04-27 DIAGNOSIS — K5790 Diverticulosis of intestine, part unspecified, without perforation or abscess without bleeding: Secondary | ICD-10-CM | POA: Diagnosis not present

## 2019-05-10 DIAGNOSIS — E876 Hypokalemia: Secondary | ICD-10-CM | POA: Diagnosis not present

## 2019-05-10 DIAGNOSIS — I10 Essential (primary) hypertension: Secondary | ICD-10-CM | POA: Diagnosis not present

## 2019-05-10 DIAGNOSIS — K91840 Postprocedural hemorrhage and hematoma of a digestive system organ or structure following a digestive system procedure: Secondary | ICD-10-CM | POA: Diagnosis not present

## 2019-05-10 DIAGNOSIS — Z09 Encounter for follow-up examination after completed treatment for conditions other than malignant neoplasm: Secondary | ICD-10-CM | POA: Diagnosis not present

## 2019-05-10 DIAGNOSIS — K529 Noninfective gastroenteritis and colitis, unspecified: Secondary | ICD-10-CM | POA: Diagnosis not present

## 2019-05-10 DIAGNOSIS — Z79899 Other long term (current) drug therapy: Secondary | ICD-10-CM | POA: Diagnosis not present

## 2019-05-18 DIAGNOSIS — B078 Other viral warts: Secondary | ICD-10-CM | POA: Diagnosis not present

## 2019-05-18 DIAGNOSIS — L821 Other seborrheic keratosis: Secondary | ICD-10-CM | POA: Diagnosis not present

## 2019-08-03 DIAGNOSIS — R2242 Localized swelling, mass and lump, left lower limb: Secondary | ICD-10-CM | POA: Diagnosis not present

## 2019-08-05 ENCOUNTER — Other Ambulatory Visit: Payer: Self-pay | Admitting: Physician Assistant

## 2019-08-05 ENCOUNTER — Other Ambulatory Visit: Payer: BC Managed Care – PPO | Admitting: Family Medicine

## 2019-08-05 DIAGNOSIS — R2242 Localized swelling, mass and lump, left lower limb: Secondary | ICD-10-CM

## 2019-08-10 ENCOUNTER — Encounter: Payer: Self-pay | Admitting: Family Medicine

## 2019-08-10 ENCOUNTER — Other Ambulatory Visit: Payer: Self-pay

## 2019-08-10 ENCOUNTER — Ambulatory Visit: Payer: Self-pay

## 2019-08-10 ENCOUNTER — Ambulatory Visit: Payer: BC Managed Care – PPO | Admitting: Family Medicine

## 2019-08-10 VITALS — BP 138/84 | Ht 63.0 in | Wt 160.0 lb

## 2019-08-10 DIAGNOSIS — D1724 Benign lipomatous neoplasm of skin and subcutaneous tissue of left leg: Secondary | ICD-10-CM | POA: Diagnosis not present

## 2019-08-10 DIAGNOSIS — R2242 Localized swelling, mass and lump, left lower limb: Secondary | ICD-10-CM | POA: Diagnosis not present

## 2019-08-10 NOTE — Patient Instructions (Signed)
The mass in your leg is a lipoma.  This is a benign (non-cancerous) mass.  It may continue to grow over the next several months.  At this point time you can just watch it.  You can follow-up in 6 months to have repeat ultrasound scan at that time.  If it is not painful it does not need to be removed.

## 2019-08-10 NOTE — Progress Notes (Signed)
Subjective:    Patient ID: Megan Castillo, female    DOB: February 20, 1966, 53 y.o.   MRN: [id number]  CC: lump on left thigh  HPI  Patient is a 53 year old female who presents with a lump on her left thigh. She first noticed the lump back in April. She denies any known trauma to this area or inciting event. Patient states it initially was non-painful, but over the last month or two the lump has been growing and now is painful to palpation. No bruising or ecchymosis reported.  It is located on the lateral portion of her thigh a few inches proximal to her lateral knee. The mass is not painful throughout the day or with activity, but is with deep palpation. The patient was simply worried as it was growing in size. She has not taken any medication, ice, or therapy for the area. No numbness.  Review of Systems See HPI, otherwise negative    Objective:   Physical Exam  Gen - no acute distress, alert, pleasant in conversation Pulm - unlabored breathing Psych - appropriate mood and affect MSK: - LLE:  small superficial bulge of skin of lateral thigh about 4-5 inches proximal to lateral epicondyle; no erythema or ecchymosis noted Palpable ~2 x 1.5 inches circumferential mass noted over lateral thigh 4-5 inches proximal to lateral femoral condyle, mobile mass, soft; feels superficial without involvement into IT band Full ROM over b/l LE's with hip extension/flexion, circumduction; knee ext/flexion  5/5 strength of b/l LE's with hip extension/flexion/abduction/adduction and knee flex/extension Sensation to light touch grossly intact of b/l LE's, neurovascular intact Negative FABER, FADIR, Nicoletta Dress, Thomas test Negative SLR  - RLE No ecchymosis or skin changes Skin warm to touch, no bony abnormalities palpated, no tenderness over greater trochanter Full ROM with flexion/extension/circumduction 5/5 muscle strength at hip and knee Sensation intact to light touch Neg SLR   Limited MSK Ultrasound  LLE: - IT band identified with appropriate striation, band intact without surrounding edema or abnormality, identification of insertion on gerdy's tubercle - Vastus lateralis muscle identified grossly intact without defect or abnormality - Subcutaneous layer of lateral thigh with increased thickness over mass proximal to lateral epicondyle; no apparent surrounding capsule or loculations noted   IMPRESSION: Lipoma of lateral thigh with increased thickness of subcutaneous fat layer    Assessment & Plan:  1. Lipoma of LLE Present x 4-5 months, slightly growing in size recently. Mass about 2 x 1.5 inches, mobile, soft without muscular/tendon involvement, indicative of fatty lipoma. No associated muscle weakness or functional deficits. TTP likely from superficial nerve involvement. No clear capsule identified on MSK U/S today, but with increased thickness of subcutaneous tissue.  Adequate visualization of IT band and vastus lateralis muscle intact without involvement.    - no need for intervention at this time, continue supportive treatment - continue with observation, possible for increased growth discussed today in office - routine follow-up in 6 months for reevaluation  - if lipoma continues to grow and/or is associated with increased pain, may consider referral to surgery for removal    Renne Musca, DO PGY-2 08/10/2019  Addendum:  Patient seen and examined with resident.  Agree with his note and findings.  Patient's area of concern corresponds to increased thickness of subcutaneous fatty tissue.  Underlying vastus lateralis, IT band, femur all appear normal.  Reassured patient.  Tylenol, icing, ibuprofen if needed.  Recommended calling us if this becomes more bothersome, otherwise would reevaluate in about 6 months.

## 2019-08-11 ENCOUNTER — Other Ambulatory Visit: Payer: BC Managed Care – PPO

## 2019-08-11 DIAGNOSIS — K219 Gastro-esophageal reflux disease without esophagitis: Secondary | ICD-10-CM | POA: Insufficient documentation

## 2019-09-16 DIAGNOSIS — L820 Inflamed seborrheic keratosis: Secondary | ICD-10-CM | POA: Diagnosis not present

## 2019-10-07 DIAGNOSIS — R748 Abnormal levels of other serum enzymes: Secondary | ICD-10-CM | POA: Diagnosis not present

## 2019-10-07 DIAGNOSIS — R1011 Right upper quadrant pain: Secondary | ICD-10-CM | POA: Diagnosis not present

## 2019-10-07 DIAGNOSIS — I10 Essential (primary) hypertension: Secondary | ICD-10-CM | POA: Diagnosis not present

## 2019-10-10 ENCOUNTER — Encounter: Payer: Self-pay | Admitting: Diagnostic Neuroimaging

## 2019-10-10 ENCOUNTER — Other Ambulatory Visit: Payer: Self-pay

## 2019-10-10 ENCOUNTER — Ambulatory Visit: Payer: BC Managed Care – PPO | Admitting: Diagnostic Neuroimaging

## 2019-10-10 VITALS — BP 151/84 | HR 66 | Temp 97.7°F | Ht 64.0 in | Wt 170.0 lb

## 2019-10-10 DIAGNOSIS — G43109 Migraine with aura, not intractable, without status migrainosus: Secondary | ICD-10-CM | POA: Diagnosis not present

## 2019-10-10 MED ORDER — RIZATRIPTAN BENZOATE 10 MG PO TBDP: 10.0000 mg | ORAL_TABLET | ORAL | 11 refills | Status: DC | PRN

## 2019-10-10 NOTE — Patient Instructions (Signed)
-   MIGRAINE RESCUE --> rizatriptan 10mg  as needed for breakthrough headache; may repeat x 1 after 2 hours; max 2 tabs per day or 8 per month  - MIGRAINE PREVENTION --> consider propranolol or amitriptyline in future

## 2019-10-10 NOTE — Progress Notes (Signed)
[facility]  PATIENT: Megan Castillo DOB: [birth year]  REFERRING CLINICIAN:  HISTORY FROM: patient  REASON FOR VISIT: follow up    HISTORICAL  CHIEF COMPLAINT:  Chief Complaint  Patient presents with  . Follow-up    1 year f/u on migraines  . Migraine    Reports stable     HISTORY OF PRESENT ILLNESS:   UPDATE (10/10/19, VRP): Since last visit, now with HA every 4-6 weeks. Sumatriptan helps, but takes a few hours to help. No alleviating or aggravating factors. Tolerating sumatriptan.    UPDATE (10/04/18, VRP): Since last visit, doing well. Symptoms are mild. Severity is mild. No alleviating or aggravating factors. Tolerating sumatriptan 50.  Avg 1 HA every 6 months.  UPDATE (12/02/17, VRP): Since last visit, doing well for several months; 1 HA every 2-3 months. Then in Oct 2018 had 1 HA per week. Tolerating sumatriptan helps. No new alleviating or aggravating factors.   UPDATE 08/05/16: Since last visit, doing well with headaches (1 every month or other month). Sumatriptan 50mg  daily well. Still with high levels of stress (husband just d/c'd from hospital today for MI).   PRIOR HPI (03/03/16): 53 year old female here for evaluation of headaches. For past 3-4 years patient has had onset of unilateral, sometimes bilateral, periorbital severe intense headache and pain. Patient typically lays down, turns down the lights, uses warm washcloth to help relieve symptoms. Patient feels tired and foggy after headaches. Sometimes she has decreased peripheral vision and scotoma during the headaches. Patient has some sensitivity to light. No nausea or vomiting. She has some dizziness. She has some sinus pressure. Patient thought she had sinus headaches for many years and went to ENT recently for evaluation. Patient was told she may have migraine headaches and referred to me for further evaluation. Patient having approximately 2-4 days of migraine headache per month. Triggering  factors include change of weather, change of season, decreased sleep. Patient tried sumatriptan in the past with mild relief.   REVIEW OF SYSTEMS: Full 14 system review of systems performed and negative with exception of: as per HPI.    ALLERGIES: No Known Allergies  HOME MEDICATIONS: Outpatient Medications Prior to Visit  Medication Sig Dispense Refill  . omeprazole (PRILOSEC) 20 MG capsule Take 20 mg by mouth daily.    . rosuvastatin (CRESTOR) 10 MG tablet Take 10 mg by mouth daily.    . SUMAtriptan (IMITREX) 100 MG tablet take 1 tablet by mouth if needed for migraines; MAY REPEAT 1 AFTER 2 HOURS; 2 PER DAY OR 8 PER MONTH 8 tablet 12  . hydrochlorothiazide (HYDRODIURIL) 12.5 MG tablet Take 1 tablet (12.5 mg total) by mouth daily. 30 tablet 0   No facility-administered medications prior to visit.     PAST MEDICAL HISTORY: Past Medical History:  Diagnosis Date  . Arthritis   . Constipation   . Hemorrhoids   . Migraine   . Rectal pain     PAST SURGICAL HISTORY: Past Surgical History:  Procedure Laterality Date  . ABDOMINAL HYSTERECTOMY  11/2009  . HEMORRHOID SURGERY      FAMILY HISTORY: Family History  Problem Relation Age of Onset  . Heart disease Mother   . Cancer Brother 85       Burkett lymphoma    SOCIAL HISTORY:  Social History   Socioeconomic History  . Marital status: Married    Spouse name: Elta Guadeloupe  . Number of children: 2  . Years of education: 52 1/2  .  Highest education level: Not on file  Occupational History    Comment: Systems Engineering  Social Needs  . Financial resource strain: Not on file  . Food insecurity    Worry: Not on file    Inability: Not on file  . Transportation needs    Medical: Not on file    Non-medical: Not on file  Tobacco Use  . Smoking status: Never Smoker  . Smokeless tobacco: Never Used  Substance and Sexual Activity  . Alcohol use: No  . Drug use: No  . Sexual activity: Yes  Lifestyle  . Physical activity     Days per week: Not on file    Minutes per session: Not on file  . Stress: Not on file  Relationships  . Social Herbalist on phone: Not on file    Gets together: Not on file    Attends religious service: Not on file    Active member of club or organization: Not on file    Attends meetings of clubs or organizations: Not on file    Relationship status: Not on file  . Intimate partner violence    Fear of current or ex partner: Not on file    Emotionally abused: Not on file    Physically abused: Not on file    Forced sexual activity: Not on file  Other Topics Concern  . Not on file  Social History Narrative   Lives at home with husband   Caffeine use-tea twice a day     PHYSICAL EXAM  GENERAL EXAM/CONSTITUTIONAL: Vitals:  Vitals:   10/10/19 1331  BP: (!) 151/84  Pulse: 66  Temp: 97.7 F (36.5 C)  TempSrc: Temporal  Weight: 170 lb (77.1 kg)  Height: 5\' 4"  (1.626 m)   Body mass index is 29.18 kg/m. No exam data present  Patient is in no distress; well developed, nourished and groomed; neck is supple  CARDIOVASCULAR:  Examination of carotid arteries is normal; no carotid bruits  Regular rate and rhythm, no murmurs  Examination of peripheral vascular system by observation and palpation is normal  EYES:  Ophthalmoscopic exam of optic discs and posterior segments is normal; no papilledema or hemorrhages  MUSCULOSKELETAL:  Gait, strength, tone, movements noted in Neurologic exam below  NEUROLOGIC: MENTAL STATUS:  No flowsheet data found.  awake, alert, oriented to person, place and time  recent and remote memory intact  normal attention and concentration  language fluent, comprehension intact, naming intact,   fund of knowledge appropriate  CRANIAL NERVE:   2nd - no papilledema on fundoscopic exam  2nd, 3rd, 4th, 6th - pupils equal and reactive to light, visual fields full to confrontation, extraocular muscles intact, no nystagmus  5th -  facial sensation symmetric  7th - facial strength symmetric  8th - hearing intact  9th - palate elevates symmetrically, uvula midline  11th - shoulder shrug symmetric  12th - tongue protrusion midline  MOTOR:   normal bulk and tone, full strength in the BUE, BLE  SENSORY:   normal and symmetric to light touch  COORDINATION:   finger-nose-finger, fine finger movements normal  REFLEXES:   deep tendon reflexes present and symmetric  GAIT/STATION:   narrow based gait    DIAGNOSTIC DATA (LABS, IMAGING, TESTING) - I reviewed patient records, labs, notes, testing and imaging myself where available.  Lab Results  Component Value Date   WBC 8.3 11/01/2018   HGB 14.0 11/01/2018   HCT 44.7 11/01/2018  MCV 92.2 11/01/2018   PLT 214 11/01/2018      Component Value Date/Time   NA 141 11/01/2018 1858   K 3.5 11/01/2018 1858   CL 107 11/01/2018 1858   CO2 21 (L) 11/01/2018 1858   GLUCOSE 93 11/01/2018 1858   BUN 10 11/01/2018 1858   CREATININE 0.83 11/01/2018 1858   CREATININE 0.70 04/10/2013 1702   CALCIUM 9.2 11/01/2018 1858   PROT 7.4 04/10/2013 1702   ALBUMIN 4.5 04/10/2013 1702   AST 28 04/10/2013 1702   ALT 28 04/10/2013 1702   ALKPHOS 80 04/10/2013 1702   BILITOT 0.3 04/10/2013 1702   GFRNONAA >60 11/01/2018 1858   GFRAA >60 11/01/2018 1858   No results found for: CHOL, HDL, LDLCALC, LDLDIRECT, TRIG, CHOLHDL No results found for: HGBA1C No results found for: VITAMINB12 No results found for: TSH   02/09/13 MRI brain (with and without contrast) 1. Few punctate subcortical, periventricular and juxtacortical foci of gliosis. These findings are non-specific and considerations include autoimmune, inflammatory, post-infectious, microvascular ischemic or migraine associated etiologies.  2. No abnormal enhancing lesions.     ASSESSMENT AND PLAN  53 y.o. year old female here with 3-4 years of headaches with migraine features. Symptoms started around 2013.  Neurologic examination MRI of the brain are unremarkable.   Was doing well on sumatriptan 50mg  prn headache, but sometimes doesn't work quite as well as before. Patient may take this with over-the-counter Tylenol or ibuprofen for relief. Advised on migraine treatment strategies, including early treatment, trigger management, and starting a headache diary.  Meds tried: sumatriptan   Dx:  1. Migraine with aura and without status migrainosus, not intractable     PLAN:  - MIGRAINE RESCUE --> rizatriptan 10mg  as needed for breakthrough headache; may repeat x 1 after 2 hours; max 2 tabs per day or 8 per month  - MIGRAINE PREVENTION --> consider propranolol or amitriptyline in future  Meds ordered this encounter  Medications  . rizatriptan (MAXALT-MLT) 10 MG disintegrating tablet    Sig: Take 1 tablet (10 mg total) by mouth as needed for migraine. May repeat in 2 hours if needed    Dispense:  9 tablet    Refill:  11   Return in about 1 year (around 10/09/2020).    Penni Bombard, MD [id number], 0000000 PM Certified in Neurology, Neurophysiology and Neuroimaging  [facility] [address] [place], [place] [address] [phone]

## 2019-10-13 DIAGNOSIS — R748 Abnormal levels of other serum enzymes: Secondary | ICD-10-CM | POA: Diagnosis not present

## 2019-10-13 DIAGNOSIS — D500 Iron deficiency anemia secondary to blood loss (chronic): Secondary | ICD-10-CM | POA: Diagnosis not present

## 2019-10-13 DIAGNOSIS — R1013 Epigastric pain: Secondary | ICD-10-CM | POA: Diagnosis not present

## 2019-10-13 DIAGNOSIS — E782 Mixed hyperlipidemia: Secondary | ICD-10-CM | POA: Diagnosis not present

## 2019-11-07 DIAGNOSIS — Z808 Family history of malignant neoplasm of other organs or systems: Secondary | ICD-10-CM | POA: Diagnosis not present

## 2019-11-07 DIAGNOSIS — L814 Other melanin hyperpigmentation: Secondary | ICD-10-CM | POA: Diagnosis not present

## 2019-11-07 DIAGNOSIS — D485 Neoplasm of uncertain behavior of skin: Secondary | ICD-10-CM | POA: Diagnosis not present

## 2019-11-07 DIAGNOSIS — L821 Other seborrheic keratosis: Secondary | ICD-10-CM | POA: Diagnosis not present

## 2019-11-09 DIAGNOSIS — Z6829 Body mass index (BMI) 29.0-29.9, adult: Secondary | ICD-10-CM | POA: Diagnosis not present

## 2019-11-09 DIAGNOSIS — Z01419 Encounter for gynecological examination (general) (routine) without abnormal findings: Secondary | ICD-10-CM | POA: Diagnosis not present

## 2019-11-09 DIAGNOSIS — Z8742 Personal history of other diseases of the female genital tract: Secondary | ICD-10-CM | POA: Diagnosis not present

## 2019-11-16 DIAGNOSIS — E7800 Pure hypercholesterolemia, unspecified: Secondary | ICD-10-CM | POA: Diagnosis not present

## 2019-11-16 DIAGNOSIS — I10 Essential (primary) hypertension: Secondary | ICD-10-CM | POA: Diagnosis not present

## 2019-11-17 DIAGNOSIS — E7800 Pure hypercholesterolemia, unspecified: Secondary | ICD-10-CM | POA: Diagnosis not present

## 2019-11-17 DIAGNOSIS — Z1159 Encounter for screening for other viral diseases: Secondary | ICD-10-CM | POA: Diagnosis not present

## 2019-11-17 DIAGNOSIS — I10 Essential (primary) hypertension: Secondary | ICD-10-CM | POA: Diagnosis not present

## 2019-11-22 DIAGNOSIS — L814 Other melanin hyperpigmentation: Secondary | ICD-10-CM | POA: Diagnosis not present

## 2019-11-22 DIAGNOSIS — D485 Neoplasm of uncertain behavior of skin: Secondary | ICD-10-CM | POA: Diagnosis not present

## 2019-11-22 DIAGNOSIS — L821 Other seborrheic keratosis: Secondary | ICD-10-CM | POA: Diagnosis not present

## 2020-01-11 DIAGNOSIS — Z1231 Encounter for screening mammogram for malignant neoplasm of breast: Secondary | ICD-10-CM | POA: Diagnosis not present

## 2020-04-13 DIAGNOSIS — I10 Essential (primary) hypertension: Secondary | ICD-10-CM | POA: Diagnosis not present

## 2020-04-13 DIAGNOSIS — E782 Mixed hyperlipidemia: Secondary | ICD-10-CM | POA: Diagnosis not present

## 2020-04-13 DIAGNOSIS — K2100 Gastro-esophageal reflux disease with esophagitis, without bleeding: Secondary | ICD-10-CM | POA: Diagnosis not present

## 2020-04-18 DIAGNOSIS — R634 Abnormal weight loss: Secondary | ICD-10-CM | POA: Diagnosis not present

## 2020-04-18 DIAGNOSIS — N390 Urinary tract infection, site not specified: Secondary | ICD-10-CM | POA: Diagnosis not present

## 2020-04-18 DIAGNOSIS — R1013 Epigastric pain: Secondary | ICD-10-CM | POA: Diagnosis not present

## 2020-04-19 ENCOUNTER — Other Ambulatory Visit: Payer: Self-pay | Admitting: Internal Medicine

## 2020-04-19 DIAGNOSIS — R634 Abnormal weight loss: Secondary | ICD-10-CM

## 2020-04-20 DIAGNOSIS — R634 Abnormal weight loss: Secondary | ICD-10-CM | POA: Diagnosis not present

## 2020-04-23 ENCOUNTER — Other Ambulatory Visit: Payer: BC Managed Care – PPO

## 2020-04-30 DIAGNOSIS — R351 Nocturia: Secondary | ICD-10-CM | POA: Diagnosis not present

## 2020-04-30 DIAGNOSIS — R102 Pelvic and perineal pain: Secondary | ICD-10-CM | POA: Diagnosis not present

## 2020-09-13 DIAGNOSIS — Z20822 Contact with and (suspected) exposure to covid-19: Secondary | ICD-10-CM | POA: Diagnosis not present

## 2020-10-10 ENCOUNTER — Other Ambulatory Visit: Payer: Self-pay

## 2020-10-10 ENCOUNTER — Ambulatory Visit: Payer: BC Managed Care – PPO | Admitting: Diagnostic Neuroimaging

## 2020-10-10 ENCOUNTER — Encounter: Payer: Self-pay | Admitting: Diagnostic Neuroimaging

## 2020-10-10 VITALS — BP 140/82 | HR 71 | Ht 64.0 in | Wt 155.0 lb

## 2020-10-10 DIAGNOSIS — H02402 Unspecified ptosis of left eyelid: Secondary | ICD-10-CM

## 2020-10-10 MED ORDER — SUMATRIPTAN SUCCINATE 100 MG PO TABS: ORAL_TABLET | ORAL | 12 refills | Status: DC

## 2020-10-10 NOTE — Progress Notes (Signed)
[facility]  PATIENT: Megan Castillo DOB: May 01, 1966  REFERRING CLINICIAN:  HISTORY FROM: patient  REASON FOR VISIT: follow up    HISTORICAL  CHIEF COMPLAINT:  Chief Complaint  Patient presents with  . Migraine    rm 7, one year FU "doing better, less migrianes/ headaches"    HISTORY OF PRESENT ILLNESS:   UPDATE (10/10/20, VRP): Since last visit, doing well. Symptoms are stable. 2 HA per month. Left ptosis (noted in 2018 ID photo) slightly more noticeable. No alleviating or aggravating factors.  UPDATE (10/10/19, VRP): Since last visit, now with HA every 4-6 weeks. Sumatriptan helps, but takes a few hours to help. No alleviating or aggravating factors. Tolerating sumatriptan.    UPDATE (10/04/18, VRP): Since last visit, doing well. Symptoms are mild. Severity is mild. No alleviating or aggravating factors. Tolerating sumatriptan 50.  Avg 1 HA every 6 months.  UPDATE (12/02/17, VRP): Since last visit, doing well for several months; 1 HA every 2-3 months. Then in Oct 2018 had 1 HA per week. Tolerating sumatriptan helps. No new alleviating or aggravating factors.   UPDATE 08/05/16: Since last visit, doing well with headaches (1 every month or other month). Sumatriptan 50mg  daily well. Still with high levels of stress (husband just d/c'd from hospital today for MI).   PRIOR HPI (03/03/16): 54 year old female here for evaluation of headaches. For past 3-4 years patient has had onset of unilateral, sometimes bilateral, periorbital severe intense headache and pain. Patient typically lays down, turns down the lights, uses warm washcloth to help relieve symptoms. Patient feels tired and foggy after headaches. Sometimes she has decreased peripheral vision and scotoma during the headaches. Patient has some sensitivity to light. No nausea or vomiting. She has some dizziness. She has some sinus pressure. Patient thought she had sinus headaches for many years and went to ENT  recently for evaluation. Patient was told she may have migraine headaches and referred to me for further evaluation. Patient having approximately 2-4 days of migraine headache per month. Triggering factors include change of weather, change of season, decreased sleep. Patient tried sumatriptan in the past with mild relief.   REVIEW OF SYSTEMS: Full 14 system review of systems performed and negative with exception of: as per HPI.    ALLERGIES: No Known Allergies  HOME MEDICATIONS: Outpatient Medications Prior to Visit  Medication Sig Dispense Refill  . rosuvastatin (CRESTOR) 10 MG tablet Take 10 mg by mouth daily.    . SUMAtriptan (IMITREX) 100 MG tablet take 1 tablet by mouth if needed for migraines; MAY REPEAT 1 AFTER 2 HOURS; 2 PER DAY OR 8 PER MONTH 8 tablet 12  . omeprazole (PRILOSEC) 20 MG capsule Take 20 mg by mouth daily.    . rizatriptan (MAXALT-MLT) 10 MG disintegrating tablet Take 1 tablet (10 mg total) by mouth as needed for migraine. May repeat in 2 hours if needed 9 tablet 11   No facility-administered medications prior to visit.    PAST MEDICAL HISTORY: Past Medical History:  Diagnosis Date  . Arthritis   . Constipation   . Hemorrhoids   . Migraine   . Rectal pain     PAST SURGICAL HISTORY: Past Surgical History:  Procedure Laterality Date  . ABDOMINAL HYSTERECTOMY  11/2009  . HEMORRHOID SURGERY      FAMILY HISTORY: Family History  Problem Relation Age of Onset  . Heart disease Mother   . Cancer Brother 19       Burkett lymphoma  SOCIAL HISTORY:  Social History   Socioeconomic History  . Marital status: Married    Spouse name: Elta Guadeloupe  . Number of children: 2  . Years of education: 14 1/2  . Highest education level: Not on file  Occupational History    Comment: Systems Engineering  Tobacco Use  . Smoking status: Never Smoker  . Smokeless tobacco: Never Used  Substance and Sexual Activity  . Alcohol use: No  . Drug use: No  . Sexual activity:  Yes  Other Topics Concern  . Not on file  Social History Narrative   Lives at home with husband   Caffeine use-tea twice a day   Social Determinants of Health   Financial Resource Strain:   . Difficulty of Paying Living Expenses: Not on file  Food Insecurity:   . Worried About Charity fundraiser in the Last Year: Not on file  . Ran Out of Food in the Last Year: Not on file  Transportation Needs:   . Lack of Transportation (Medical): Not on file  . Lack of Transportation (Non-Medical): Not on file  Physical Activity:   . Days of Exercise per Week: Not on file  . Minutes of Exercise per Session: Not on file  Stress:   . Feeling of Stress : Not on file  Social Connections:   . Frequency of Communication with Friends and Family: Not on file  . Frequency of Social Gatherings with Friends and Family: Not on file  . Attends Religious Services: Not on file  . Active Member of Clubs or Organizations: Not on file  . Attends Archivist Meetings: Not on file  . Marital Status: Not on file  Intimate Partner Violence:   . Fear of Current or Ex-Partner: Not on file  . Emotionally Abused: Not on file  . Physically Abused: Not on file  . Sexually Abused: Not on file     PHYSICAL EXAM  GENERAL EXAM/CONSTITUTIONAL: Vitals:  Vitals:   10/10/20 1333  BP: 140/82  Pulse: 71  Weight: 155 lb (70.3 kg)  Height: 5\' 4"  (1.626 m)   Body mass index is 26.61 kg/m. No exam data present  Patient is in no distress; well developed, nourished and groomed; neck is supple  CARDIOVASCULAR:  Examination of carotid arteries is normal; no carotid bruits  Regular rate and rhythm, no murmurs  Examination of peripheral vascular system by observation and palpation is normal  EYES:  Ophthalmoscopic exam of optic discs and posterior segments is normal; no papilledema or hemorrhages  MUSCULOSKELETAL:  Gait, strength, tone, movements noted in Neurologic exam below  NEUROLOGIC: MENTAL  STATUS:  No flowsheet data found.  awake, alert, oriented to person, place and time  recent and remote memory intact  normal attention and concentration  language fluent, comprehension intact, naming intact,   fund of knowledge appropriate  CRANIAL NERVE:   2nd - no papilledema on fundoscopic exam  2nd, 3rd, 4th, 6th - pupils reactive to light (IN LIGHT RIGHT 4, LEFT 3; IN DARK RIGHT 5, LEFT 4), visual fields full to confrontation, extraocular muscles intact, no nystagmus; MILD LEFT PTOSIS  5th - facial sensation symmetric  7th - facial strength symmetric  8th - hearing intact  9th - palate elevates symmetrically, uvula midline  11th - shoulder shrug symmetric  12th - tongue protrusion midline  MOTOR:   normal bulk and tone, full strength in the BUE, BLE  SENSORY:   normal and symmetric to light touch  COORDINATION:  finger-nose-finger, fine finger movements normal  REFLEXES:   deep tendon reflexes present and symmetric  GAIT/STATION:   narrow based gait    DIAGNOSTIC DATA (LABS, IMAGING, TESTING) - I reviewed patient records, labs, notes, testing and imaging myself where available.  Lab Results  Component Value Date   WBC 8.3 11/01/2018   HGB 14.0 11/01/2018   HCT 44.7 11/01/2018   MCV 92.2 11/01/2018   PLT 214 11/01/2018      Component Value Date/Time   NA 141 11/01/2018 1858   K 3.5 11/01/2018 1858   CL 107 11/01/2018 1858   CO2 21 (L) 11/01/2018 1858   GLUCOSE 93 11/01/2018 1858   BUN 10 11/01/2018 1858   CREATININE 0.83 11/01/2018 1858   CREATININE 0.70 04/10/2013 1702   CALCIUM 9.2 11/01/2018 1858   PROT 7.4 04/10/2013 1702   ALBUMIN 4.5 04/10/2013 1702   AST 28 04/10/2013 1702   ALT 28 04/10/2013 1702   ALKPHOS 80 04/10/2013 1702   BILITOT 0.3 04/10/2013 1702   GFRNONAA >60 11/01/2018 1858   GFRAA >60 11/01/2018 1858   No results found for: CHOL, HDL, LDLCALC, LDLDIRECT, TRIG, CHOLHDL No results found for: HGBA1C No results  found for: VITAMINB12 No results found for: TSH   02/09/13 MRI brain (with and without contrast) 1. Few punctate subcortical, periventricular and juxtacortical foci of gliosis. These findings are non-specific and considerations include autoimmune, inflammatory, post-infectious, microvascular ischemic or migraine associated etiologies.  2. No abnormal enhancing lesions.     ASSESSMENT AND PLAN  54 y.o. year old female here with 3-4 years of headaches with migraine features. Symptoms started around 2013.  Meds tried: sumatriptan   Dx:  1. 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 MR BRAIN W WO CONTRAST  . MR ANGIO HEAD WO CONTRAST  . MR ANGIO NECK W WO CONTRAST  . AChR Abs with Reflex to MuSK  . TSH  . Ambulatory referral to Ophthalmology   Meds ordered this encounter  Medications  . SUMAtriptan (IMITREX) 100 MG tablet    Sig: take 1 tablet by mouth if needed for migraines; MAY REPEAT 1 AFTER 2 HOURS; 2 PER DAY OR 8 PER MONTH    Dispense:  8 tablet    Refill:  12   Return in about 6 months (around 04/09/2021).    Penni Bombard, MD 86/06/6719, 9:47 PM Certified in Neurology, Neurophysiology and Neuroimaging  [facility] [address], [place],  [address] [phone]

## 2020-10-10 NOTE — Patient Instructions (Signed)
  MIGRAINE RESCUE --> rizatriptan 10mg  as needed for breakthrough headache; may repeat x 1 after 2 hours; max 2 tabs per day or 8 per month  LEFT PTOSIS - check MRI, MRA head / neck - check AcHR - follow up with ophthalmology

## 2020-10-11 ENCOUNTER — Telehealth: Payer: Self-pay | Admitting: Diagnostic Neuroimaging

## 2020-10-11 DIAGNOSIS — J029 Acute pharyngitis, unspecified: Secondary | ICD-10-CM | POA: Diagnosis not present

## 2020-10-11 DIAGNOSIS — J0100 Acute maxillary sinusitis, unspecified: Secondary | ICD-10-CM | POA: Diagnosis not present

## 2020-10-11 DIAGNOSIS — Z20822 Contact with and (suspected) exposure to covid-19: Secondary | ICD-10-CM | POA: Diagnosis not present

## 2020-10-11 NOTE — Telephone Encounter (Signed)
LVM for pt to call back i need to know if she has insurance

## 2020-10-11 NOTE — Telephone Encounter (Signed)
Pt called, [facility] denied referral and sent back to [place]. Was told they do not do the test Dr. Leta Baptist requested. Would like a call from the nurse.

## 2020-10-12 NOTE — Telephone Encounter (Signed)
Called and spoke to patient and relayed to her that I would call and see if Dr. Patrici Ranks office could see her. Patient understood and was fine with that.

## 2020-10-15 NOTE — Telephone Encounter (Signed)
Called Dr. Zenia Resides Office and they could see patient for Ptosis There first opening was in December . They will Put patient on Cx list .    OP.   does not see for Ptosis

## 2020-10-15 NOTE — Telephone Encounter (Signed)
no to the covid questions MR Brain w/wo contrast, MRA Head wo contrast & MRA Neck w/wo contrast Dr. Conley Simmonds Auth: [place] Ref # [id number]. Patient is scheduled at [facility] for 10/24/20.

## 2020-10-20 LAB — TSH: TSH: 3.32 u[IU]/mL (ref 0.450–4.500)

## 2020-10-20 LAB — MUSK ANTIBODIES: MuSK Antibodies: <1 U/mL

## 2020-10-20 LAB — ACHR ABS WITH REFLEX TO MUSK: AChR Binding Ab, Serum: <0.03 nmol/L (ref 0.00–0.24)

## 2020-10-22 ENCOUNTER — Encounter: Payer: Self-pay | Admitting: *Deleted

## 2020-10-24 ENCOUNTER — Other Ambulatory Visit: Payer: BC Managed Care – PPO

## 2020-10-25 DIAGNOSIS — D225 Melanocytic nevi of trunk: Secondary | ICD-10-CM | POA: Diagnosis not present

## 2020-10-25 DIAGNOSIS — L720 Epidermal cyst: Secondary | ICD-10-CM | POA: Diagnosis not present

## 2020-10-25 DIAGNOSIS — L738 Other specified follicular disorders: Secondary | ICD-10-CM | POA: Diagnosis not present

## 2020-10-25 DIAGNOSIS — L820 Inflamed seborrheic keratosis: Secondary | ICD-10-CM | POA: Diagnosis not present

## 2020-11-06 ENCOUNTER — Other Ambulatory Visit: Payer: BC Managed Care – PPO

## 2020-11-08 DIAGNOSIS — L820 Inflamed seborrheic keratosis: Secondary | ICD-10-CM | POA: Diagnosis not present

## 2020-11-08 DIAGNOSIS — L814 Other melanin hyperpigmentation: Secondary | ICD-10-CM | POA: Diagnosis not present

## 2020-11-08 DIAGNOSIS — L918 Other hypertrophic disorders of the skin: Secondary | ICD-10-CM | POA: Diagnosis not present

## 2020-11-09 DIAGNOSIS — Z01419 Encounter for gynecological examination (general) (routine) without abnormal findings: Secondary | ICD-10-CM | POA: Diagnosis not present

## 2020-11-09 DIAGNOSIS — Z6828 Body mass index (BMI) 28.0-28.9, adult: Secondary | ICD-10-CM | POA: Diagnosis not present

## 2020-11-09 DIAGNOSIS — Z13820 Encounter for screening for osteoporosis: Secondary | ICD-10-CM | POA: Diagnosis not present

## 2020-11-14 ENCOUNTER — Other Ambulatory Visit: Payer: BC Managed Care – PPO

## 2020-11-22 DIAGNOSIS — M546 Pain in thoracic spine: Secondary | ICD-10-CM | POA: Diagnosis not present

## 2020-11-28 DIAGNOSIS — H02402 Unspecified ptosis of left eyelid: Secondary | ICD-10-CM | POA: Diagnosis not present

## 2020-12-05 DIAGNOSIS — I10 Essential (primary) hypertension: Secondary | ICD-10-CM | POA: Diagnosis not present

## 2020-12-05 DIAGNOSIS — R079 Chest pain, unspecified: Secondary | ICD-10-CM | POA: Diagnosis not present

## 2020-12-06 DIAGNOSIS — R079 Chest pain, unspecified: Secondary | ICD-10-CM | POA: Diagnosis not present

## 2020-12-18 ENCOUNTER — Other Ambulatory Visit: Payer: BC Managed Care – PPO

## 2020-12-19 ENCOUNTER — Institutional Professional Consult (permissible substitution): Payer: BC Managed Care – PPO | Admitting: Plastic Surgery

## 2021-01-15 ENCOUNTER — Ambulatory Visit: Payer: BC Managed Care – PPO | Admitting: Plastic Surgery

## 2021-01-15 ENCOUNTER — Encounter: Payer: Self-pay | Admitting: Plastic Surgery

## 2021-01-15 ENCOUNTER — Other Ambulatory Visit: Payer: Self-pay

## 2021-01-15 DIAGNOSIS — M549 Dorsalgia, unspecified: Secondary | ICD-10-CM | POA: Insufficient documentation

## 2021-01-15 DIAGNOSIS — G8929 Other chronic pain: Secondary | ICD-10-CM | POA: Diagnosis not present

## 2021-01-15 DIAGNOSIS — N62 Hypertrophy of breast: Secondary | ICD-10-CM

## 2021-01-15 DIAGNOSIS — M546 Pain in thoracic spine: Secondary | ICD-10-CM | POA: Diagnosis not present

## 2021-01-15 NOTE — Progress Notes (Signed)
Patient ID: Megan Castillo, female    DOB: [birth year], 55 y.o.   MRN: [id number]   Chief Complaint  Patient presents with  . Consult  . Breast Problem    Mammary Hyperplasia: The patient is a 55 y.o. female with a history of mammary hyperplasia for several years.  She has extremely large breasts causing symptoms that include the following: Back pain in the upper and lower back, including neck pain. She pulls or pins her bra straps to provide better lift and relief of the pressure and pain. She notices relief by holding her breast up manually.  Her shoulder straps cause grooves and pain and pressure that requires padding for relief. Pain medication is sometimes required with motrin and tylenol.  Activities that are hindered by enlarged breasts include: exercise and running.  She has tried supportive clothing as well as fitted bras without improvement.  Her breasts are extremely large and fairly symmetric.  She has hyperpigmentation of the inframammary area on both sides.  The sternal to nipple distance on the right is 27 cm and the left is 27 cm.  The IMF distance is 11 cm.  She is 5 feet 4 inches tall and weighs 159 pounds.  Preoperative bra size = 38 DD cup.  He would like to be a B/C cup size. The estimated excess breast tissue to be removed at the time of surgery = 425 grams on the left and 425 grams on the right.  Mammogram history: February 2021 and was negative.  She is due for another one next week and will have the results sent to [place].  Family history of breast cancer: none.  Tobacco use:  None.  She has only uses and hyperlipidemia.  She had a biopsy of the left breast last year which was negative.  She is also had a hysterectomy.   Review of Systems  Constitutional: Positive for activity change. Negative for appetite change.  HENT: Negative.   Eyes: Negative.   Respiratory: Negative.   Cardiovascular: Negative.  Negative for leg swelling.  Gastrointestinal: Negative.  Negative for  abdominal pain.  Endocrine: Negative.   Genitourinary: Negative.   Musculoskeletal: Positive for back pain and neck pain.  Neurological: Negative.   Hematological: Negative.   Psychiatric/Behavioral: Negative.     Past Medical History:  Diagnosis Date  . Arthritis   . Constipation   . Hemorrhoids   . Migraine   . Rectal pain     Past Surgical History:  Procedure Laterality Date  . ABDOMINAL HYSTERECTOMY  11/2009  . HEMORRHOID SURGERY        Current Outpatient Medications:  .  rosuvastatin (CRESTOR) 10 MG tablet, Take 10 mg by mouth daily., Disp: , Rfl:  .  SUMAtriptan (IMITREX) 100 MG tablet, take 1 tablet by mouth if needed for migraines; MAY REPEAT 1 AFTER 2 HOURS; 2 PER DAY OR 8 PER MONTH, Disp: 8 tablet, Rfl: 12   Objective:   Vitals:   01/15/21 0843  BP: (!) 143/85  Pulse: 83  Temp: 98.2 F (36.8 C)  SpO2: 99%    Physical Exam Vitals and nursing note reviewed.  Constitutional:      Appearance: Normal appearance.  HENT:     Head: Normocephalic and atraumatic.  Cardiovascular:     Rate and Rhythm: Normal rate.     Pulses: Normal pulses.  Pulmonary:     Effort: Pulmonary effort is normal. No respiratory distress.     Breath sounds: No  wheezing.  Abdominal:     General: Abdomen is flat. There is no distension.     Tenderness: There is no abdominal tenderness.  Musculoskeletal:        General: No swelling or deformity. Normal range of motion.  Skin:    General: Skin is warm.     Capillary Refill: Capillary refill takes less than 2 seconds.  Neurological:     General: No focal deficit present.     Mental Status: She is alert and oriented to person, place, and time.  Psychiatric:        Mood and Affect: Mood normal.        Behavior: Behavior normal.        Thought Content: Thought content normal.     Assessment & Plan:  Chronic bilateral thoracic back pain  Symptomatic mammary hypertrophy  The patient is a good candidate for bilateral breast  reduction with possible liposuction. We will need the results of her mammogram from next week.  She signed the consent form. She is aware of the scars.  She is also aware there could be a change in her nipple areola sensation.  Pictures were obtained of the patient and placed in the chart with the patient's or guardian's permission.  [place], DO

## 2021-01-16 DIAGNOSIS — Z1231 Encounter for screening mammogram for malignant neoplasm of breast: Secondary | ICD-10-CM | POA: Diagnosis not present

## 2021-02-07 DIAGNOSIS — H02402 Unspecified ptosis of left eyelid: Secondary | ICD-10-CM | POA: Diagnosis not present

## 2021-02-13 ENCOUNTER — Telehealth: Payer: Self-pay | Admitting: Diagnostic Neuroimaging

## 2021-02-13 DIAGNOSIS — T148XXA Other injury of unspecified body region, initial encounter: Secondary | ICD-10-CM | POA: Diagnosis not present

## 2021-02-13 DIAGNOSIS — B078 Other viral warts: Secondary | ICD-10-CM | POA: Diagnosis not present

## 2021-02-13 NOTE — Telephone Encounter (Signed)
Patient called on Monday 02/11/21 to r/s her MRI's I rechecked her benefits yesterday on 38/22 and spoke to two different people and they were telling me something different that the time last times I have called. I spoke with the patient and explained the situation she stated that she will speak with her husband and get back to me.

## 2021-04-01 DIAGNOSIS — Z0000 Encounter for general adult medical examination without abnormal findings: Secondary | ICD-10-CM | POA: Diagnosis not present

## 2021-04-01 DIAGNOSIS — I10 Essential (primary) hypertension: Secondary | ICD-10-CM | POA: Diagnosis not present

## 2021-04-02 ENCOUNTER — Ambulatory Visit (INDEPENDENT_AMBULATORY_CARE_PROVIDER_SITE_OTHER): Payer: BC Managed Care – PPO | Admitting: Surgical

## 2021-04-02 ENCOUNTER — Encounter: Payer: Self-pay | Admitting: Surgical

## 2021-04-02 ENCOUNTER — Other Ambulatory Visit: Payer: Self-pay

## 2021-04-02 VITALS — BP 135/88 | HR 73 | Ht 64.0 in | Wt 157.4 lb

## 2021-04-02 DIAGNOSIS — G8929 Other chronic pain: Secondary | ICD-10-CM

## 2021-04-02 DIAGNOSIS — M546 Pain in thoracic spine: Secondary | ICD-10-CM

## 2021-04-02 DIAGNOSIS — N62 Hypertrophy of breast: Secondary | ICD-10-CM

## 2021-04-02 MED ORDER — ONDANSETRON HCL 4 MG PO TABS: 4.0000 mg | ORAL_TABLET | Freq: Three times a day (TID) | ORAL | 0 refills | Status: DC | PRN

## 2021-04-02 MED ORDER — HYDROCODONE-ACETAMINOPHEN 5-325 MG PO TABS: 1.0000 | ORAL_TABLET | Freq: Four times a day (QID) | ORAL | 0 refills | Status: AC | PRN

## 2021-04-02 MED ORDER — CEPHALEXIN 500 MG PO CAPS: 500.0000 mg | ORAL_CAPSULE | Freq: Four times a day (QID) | ORAL | 0 refills | Status: AC

## 2021-04-02 NOTE — H&P (View-Only) (Signed)
   Patient ID: Megan Castillo, female    DOB: 02/01/1966, 55 y.o.   MRN: [id number]  Chief Complaint  Patient presents with  . Pre-op Exam      ICD-10-CM   1. Chronic bilateral thoracic back pain  M54.6    G89.29   2. Symptomatic mammary hypertrophy  N62     History of Present Illness: Megan Castillo is a 55 y.o.  female  with a history of macromastia.  She presents for preoperative evaluation for upcoming procedure, Bilateral Breast Reduction with possible liposuction, scheduled for 04/25/2021 with Dr.  Dillingham  The patient has not had problems with anesthesia. No history of DVT/PE. Mother with hx of DVT, but was very unhealthy per patient. No other FMHx. No family or personal history of bleeding or clotting disorders.  Patient is not currently taking any blood thinners.  No history of CVA/MI.   Summary of Previous Visit: STN on the right is 27 cm and the left is 27 cm.  Preop bra size is 38 double D cup.  Would like to be a C cup Estimated excess breast tissue to be removed at time of surgery: 425 on the left and 425 on the right.  Job: WFH, computer work  PMH Significant for: Hypertension and GERD   Past Medical History: Allergies: No Known Allergies  Current Medications:  Current Outpatient Medications:  .  cephALEXin (KEFLEX) 500 MG capsule, Take 1 capsule (500 mg total) by mouth 4 (four) times daily for 3 days., Disp: 12 capsule, Rfl: 0 .  HYDROcodone-acetaminophen (NORCO) 5-325 MG tablet, Take 1 tablet by mouth every 6 (six) hours as needed for up to 5 days for severe pain., Disp: 20 tablet, Rfl: 0 .  ondansetron (ZOFRAN) 4 MG tablet, Take 1 tablet (4 mg total) by mouth every 8 (eight) hours as needed for nausea or vomiting., Disp: 20 tablet, Rfl: 0 .  rosuvastatin (CRESTOR) 10 MG tablet, Take 10 mg by mouth daily., Disp: , Rfl:  .  SUMAtriptan (IMITREX) 100 MG tablet, take 1 tablet by mouth if needed for migraines; MAY REPEAT 1 AFTER 2 HOURS; 2 PER DAY OR 8 PER MONTH,  Disp: 8 tablet, Rfl: 12  Past Medical Problems: Past Medical History:  Diagnosis Date  . Arthritis   . Constipation   . Hemorrhoids   . Migraine   . Rectal pain     Past Surgical History: Past Surgical History:  Procedure Laterality Date  . ABDOMINAL HYSTERECTOMY  11/2009  . HEMORRHOID SURGERY      Social History: Social History   Socioeconomic History  . Marital status: Married    Spouse name: Mark  . Number of children: 2  . Years of education: 13 1/2  . Highest education level: Not on file  Occupational History    Comment: Systems Engineering  Tobacco Use  . Smoking status: Never Smoker  . Smokeless tobacco: Never Used  Substance and Sexual Activity  . Alcohol use: No  . Drug use: No  . Sexual activity: Yes  Other Topics Concern  . Not on file  Social History Narrative   Lives at home with husband   Caffeine use-tea twice a day   Social Determinants of Health   Financial Resource Strain: Not on file  Food Insecurity: Not on file  Transportation Needs: Not on file  Physical Activity: Not on file  Stress: Not on file  Social Connections: Not on file  Intimate Partner Violence: Not on file      Family History: Family History  Problem Relation Age of Onset  . Heart disease Mother   . Cancer Brother 38       Burkett lymphoma    Review of Systems: Review of Systems  Constitutional: Negative.   Respiratory: Negative.   Cardiovascular: Negative.   Gastrointestinal: Negative.   Neurological: Negative.     Physical Exam: Vital Signs BP 135/88 (BP Location: Left Arm, Patient Position: Sitting, Cuff Size: Normal)   Pulse 73   Ht 5' 4" (1.626 m)   Wt 157 lb 6.4 oz (71.4 kg)   SpO2 97%   BMI 27.02 kg/m   Physical Exam Constitutional:      General: Not in acute distress.    Appearance: Normal appearance. Not ill-appearing.  HENT:     Head: Normocephalic and atraumatic.  Eyes:     Pupils: Pupils are equal, round Neck:     Musculoskeletal:  Normal range of motion.  Cardiovascular:     Rate and Rhythm: Normal rate    Pulses: Normal pulses.  Pulmonary:     Effort: Pulmonary effort is normal. No respiratory distress.  Abdominal:     General: Abdomen is flat. There is no distension.  Musculoskeletal: Normal range of motion.  Skin:    General: Skin is warm and dry.     Findings: No erythema or rash.  Neurological:     General: No focal deficit present.     Mental Status: Alert and oriented to person, place, and time. Mental status is at baseline.     Motor: No weakness.  Psychiatric:        Mood and Affect: Mood normal.        Behavior: Behavior normal.    Assessment/Plan: The patient is scheduled for bilateral breast reduction with Dr. Dillingham.  Risks, benefits, and alternatives of procedure discussed, questions answered and consent obtained.    Smoking Status: non smoker; Counseling Given? na Last Mammogram: February 2022; Results: Negative  Caprini Score: 7, high; Risk Factors include: Age, BMI greater than 25, mother with history of DVT and length of planned surgery. Recommendation for mechanical and pharmacological prophylaxis. Encourage early ambulation.  Given the patient's overall health status and her mobility, do not think postoperative anticoagulation will be necessary.  Pictures obtained: @consult  Post-op Rx sent to pharmacy: Norco, Zofran, Keflex  Patient was provided with the breast reduction and General Surgical Risk consent document and Pain Medication Agreement prior to their appointment.  They had adequate time to read through the risk consent documents and Pain Medication Agreement. We also discussed them in person together during this preop appointment. All of their questions were answered to their satisfaction.  Recommended calling if they have any further questions.  Risk consent form and Pain Medication Agreement to be scanned into patient's chart.  The risk that can be encountered with breast  reduction were discussed and include the following but not limited to these:  Breast asymmetry, fluid accumulation, firmness of the breast, inability to breast feed, loss of nipple or areola, skin loss, decrease or no nipple sensation, fat necrosis of the breast tissue, bleeding, infection, healing delay.  There are risks of anesthesia, changes to skin sensation and injury to nerves or blood vessels.  The muscle can be temporarily or permanently injured.  You may have an allergic reaction to tape, suture, glue, blood products which can result in skin discoloration, swelling, pain, skin lesions, poor healing.  Any of these can lead to the need for revisonal   surgery or stage procedures.  A reduction has potential to interfere with diagnostic procedures.  Nipple or breast piercing can increase risks of infection.  This procedure is best done when the breast is fully developed.  Changes in the breast will continue to occur over time.  Pregnancy can alter the outcomes of previous breast reduction surgery, weight gain and weigh loss can also effect the long term appearance.   The risks that can be encountered with and after liposuction were discussed and include the following but no limited to these:  Asymmetry, fluid accumulation, firmness of the area, fat necrosis with death of fat tissue, bleeding, infection, delayed healing, anesthesia risks, skin sensation changes, injury to structures including nerves, blood vessels, and muscles which may be temporary or permanent, allergies to tape, suture materials and glues, blood products, topical preparations or injected agents, skin and contour irregularities, skin discoloration and swelling, deep vein thrombosis, cardiac and pulmonary complications, pain, which may persist, persistent pain, recurrence of the lesion, poor healing of the incision, possible need for revisional surgery or staged procedures. Thiere can also be persistent swelling, poor wound healing, rippling  or loose skin, worsening of cellulite, swelling, and thermal burn or heat injury from ultrasound with the ultrasound-assisted lipoplasty technique. Any change in weight fluctuations can alter the outcome.    Electronically signed by: Carola Rhine Stacey Maura, PA-C 04/02/2021 9:57 AM

## 2021-04-02 NOTE — Progress Notes (Signed)
Patient ID: Megan Castillo, female    DOB: [birth year], 55 y.o.   MRN: [id number]  Chief Complaint  Patient presents with  . Pre-op Exam      ICD-10-CM   1. Chronic bilateral thoracic back pain  M54.6    G89.29   2. Symptomatic mammary hypertrophy  N62     History of Present Illness: Megan Castillo is a 55 y.o.  female  with a history of macromastia.  She presents for preoperative evaluation for upcoming procedure, Bilateral Breast Reduction with possible liposuction, scheduled for 04/25/2021 with Dr.  Marla Roe  The patient has not had problems with anesthesia. No history of DVT/PE. Mother with hx of DVT, but was very unhealthy per patient. No other FMHx. No family or personal history of bleeding or clotting disorders.  Patient is not currently taking any blood thinners.  No history of CVA/MI.   Summary of Previous Visit: STN on the right is 27 cm and the left is 27 cm.  Preop bra size is 38 double D cup.  Would like to be a C cup Estimated excess breast tissue to be removed at time of surgery: 425 on the left and 425 on the right.  Job: [employer], computer work  [place] Significant for: Hypertension and GERD   Past Medical History: Allergies: No Known Allergies  Current Medications:  Current Outpatient Medications:  .  cephALEXin (KEFLEX) 500 MG capsule, Take 1 capsule (500 mg total) by mouth 4 (four) times daily for 3 days., Disp: 12 capsule, Rfl: 0 .  HYDROcodone-acetaminophen (NORCO) 5-325 MG tablet, Take 1 tablet by mouth every 6 (six) hours as needed for up to 5 days for severe pain., Disp: 20 tablet, Rfl: 0 .  ondansetron (ZOFRAN) 4 MG tablet, Take 1 tablet (4 mg total) by mouth every 8 (eight) hours as needed for nausea or vomiting., Disp: 20 tablet, Rfl: 0 .  rosuvastatin (CRESTOR) 10 MG tablet, Take 10 mg by mouth daily., Disp: , Rfl:  .  SUMAtriptan (IMITREX) 100 MG tablet, take 1 tablet by mouth if needed for migraines; MAY REPEAT 1 AFTER 2 HOURS; 2 PER DAY OR 8 PER MONTH,  Disp: 8 tablet, Rfl: 12  Past Medical Problems: Past Medical History:  Diagnosis Date  . Arthritis   . Constipation   . Hemorrhoids   . Migraine   . Rectal pain     Past Surgical History: Past Surgical History:  Procedure Laterality Date  . ABDOMINAL HYSTERECTOMY  11/2009  . HEMORRHOID SURGERY      Social History: Social History   Socioeconomic History  . Marital status: Married    Spouse name: Elta Guadeloupe  . Number of children: 2  . Years of education: 59 1/2  . Highest education level: Not on file  Occupational History    Comment: Systems Engineering  Tobacco Use  . Smoking status: Never Smoker  . Smokeless tobacco: Never Used  Substance and Sexual Activity  . Alcohol use: No  . Drug use: No  . Sexual activity: Yes  Other Topics Concern  . Not on file  Social History Narrative   Lives at home with husband   Caffeine use-tea twice a day   Social Determinants of Health   Financial Resource Strain: Not on file  Food Insecurity: Not on file  Transportation Needs: Not on file  Physical Activity: Not on file  Stress: Not on file  Social Connections: Not on file  Intimate Partner Violence: Not on file  Family History: Family History  Problem Relation Age of Onset  . Heart disease Mother   . Cancer Brother 44       Burkett lymphoma    Review of Systems: Review of Systems  Constitutional: Negative.   Respiratory: Negative.   Cardiovascular: Negative.   Gastrointestinal: Negative.   Neurological: Negative.     Physical Exam: Vital Signs BP 135/88 (BP Location: Left Arm, Patient Position: Sitting, Cuff Size: Normal)   Pulse 73   Ht 5\' 4"  (1.626 m)   Wt 157 lb 6.4 oz (71.4 kg)   SpO2 97%   BMI 27.02 kg/m   Physical Exam Constitutional:      General: Not in acute distress.    Appearance: Normal appearance. Not ill-appearing.  HENT:     Head: Normocephalic and atraumatic.  Eyes:     Pupils: Pupils are equal, round Neck:     Musculoskeletal:  Normal range of motion.  Cardiovascular:     Rate and Rhythm: Normal rate    Pulses: Normal pulses.  Pulmonary:     Effort: Pulmonary effort is normal. No respiratory distress.  Abdominal:     General: Abdomen is flat. There is no distension.  Musculoskeletal: Normal range of motion.  Skin:    General: Skin is warm and dry.     Findings: No erythema or rash.  Neurological:     General: No focal deficit present.     Mental Status: Alert and oriented to person, place, and time. Mental status is at baseline.     Motor: No weakness.  Psychiatric:        Mood and Affect: Mood normal.        Behavior: Behavior normal.    Assessment/Plan: The patient is scheduled for bilateral breast reduction with Dr. Marla Roe.  Risks, benefits, and alternatives of procedure discussed, questions answered and consent obtained.    Smoking Status: non smoker; Counseling Given? na Last Mammogram: February 2022; Results: Negative  Caprini Score: 7, high; Risk Factors include: Age, BMI greater than 25, mother with history of DVT and length of planned surgery. Recommendation for mechanical and pharmacological prophylaxis. Encourage early ambulation.  Given the patient's overall health status and her mobility, do not think postoperative anticoagulation will be necessary.  Pictures obtained: @consult   Post-op Rx sent to pharmacy: Norco, Zofran, Keflex  Patient was provided with the breast reduction and General Surgical Risk consent document and Pain Medication Agreement prior to their appointment.  They had adequate time to read through the risk consent documents and Pain Medication Agreement. We also discussed them in person together during this preop appointment. All of their questions were answered to their satisfaction.  Recommended calling if they have any further questions.  Risk consent form and Pain Medication Agreement to be scanned into patient's chart.  The risk that can be encountered with breast  reduction were discussed and include the following but not limited to these:  Breast asymmetry, fluid accumulation, firmness of the breast, inability to breast feed, loss of nipple or areola, skin loss, decrease or no nipple sensation, fat necrosis of the breast tissue, bleeding, infection, healing delay.  There are risks of anesthesia, changes to skin sensation and injury to nerves or blood vessels.  The muscle can be temporarily or permanently injured.  You may have an allergic reaction to tape, suture, glue, blood products which can result in skin discoloration, swelling, pain, skin lesions, poor healing.  Any of these can lead to the need for revisonal  surgery or stage procedures.  A reduction has potential to interfere with diagnostic procedures.  Nipple or breast piercing can increase risks of infection.  This procedure is best done when the breast is fully developed.  Changes in the breast will continue to occur over time.  Pregnancy can alter the outcomes of previous breast reduction surgery, weight gain and weigh loss can also effect the long term appearance.   The risks that can be encountered with and after liposuction were discussed and include the following but no limited to these:  Asymmetry, fluid accumulation, firmness of the area, fat necrosis with death of fat tissue, bleeding, infection, delayed healing, anesthesia risks, skin sensation changes, injury to structures including nerves, blood vessels, and muscles which may be temporary or permanent, allergies to tape, suture materials and glues, blood products, topical preparations or injected agents, skin and contour irregularities, skin discoloration and swelling, deep vein thrombosis, cardiac and pulmonary complications, pain, which may persist, persistent pain, recurrence of the lesion, poor healing of the incision, possible need for revisional surgery or staged procedures. Thiere can also be persistent swelling, poor wound healing, rippling  or loose skin, worsening of cellulite, swelling, and thermal burn or heat injury from ultrasound with the ultrasound-assisted lipoplasty technique. Any change in weight fluctuations can alter the outcome.    Electronically signed by: Carola Rhine Temple Sporer, PA-C 04/02/2021 9:57 AM

## 2021-04-04 DIAGNOSIS — H6983 Other specified disorders of Eustachian tube, bilateral: Secondary | ICD-10-CM | POA: Diagnosis not present

## 2021-04-04 DIAGNOSIS — Z0000 Encounter for general adult medical examination without abnormal findings: Secondary | ICD-10-CM | POA: Diagnosis not present

## 2021-04-04 DIAGNOSIS — E782 Mixed hyperlipidemia: Secondary | ICD-10-CM | POA: Diagnosis not present

## 2021-04-04 DIAGNOSIS — Z01818 Encounter for other preprocedural examination: Secondary | ICD-10-CM | POA: Diagnosis not present

## 2021-04-04 MED ORDER — ALPRAZOLAM 0.5 MG PO TABS: ORAL_TABLET | ORAL | 0 refills | Status: DC

## 2021-04-04 NOTE — Telephone Encounter (Signed)
She also informed me she is claustrophobic and would need something to help her. She is aware to have a driver.

## 2021-04-04 NOTE — Addendum Note (Signed)
Addended by: Andrey Spearman R on: 04/04/2021 11:29 AM   Modules accepted: Orders

## 2021-04-04 NOTE — Telephone Encounter (Signed)
Meds ordered this encounter  Medications  . ALPRAZolam (XANAX) 0.5 MG tablet    Sig: for sedation before MRI scan; take 1 tab 1 hour before scan; may repeat 1 tab 15 min before scan    Dispense:  3 tablet    Refill:  0    Penni Bombard, MD 0/93/8182, 99:37 AM Certified in Neurology, Neurophysiology and [facility] [address], [place], [place] [address] [phone]

## 2021-04-04 NOTE — Telephone Encounter (Signed)
Patient called wanting to schedule her MRI now.  no to the covid questions MR Brain w/wo contrast, MRA Head wo contrast & MRA Neck w/wo contrast Dr. Conley Simmonds auth:NPR spoke to [facility] Ref # [id number]  Patient is scheduled at [facility] for 04/16/21.

## 2021-04-16 ENCOUNTER — Ambulatory Visit: Payer: Self-pay | Admitting: Diagnostic Neuroimaging

## 2021-04-16 ENCOUNTER — Ambulatory Visit: Payer: BC Managed Care – PPO

## 2021-04-16 DIAGNOSIS — H02402 Unspecified ptosis of left eyelid: Secondary | ICD-10-CM | POA: Diagnosis not present

## 2021-04-16 MED ORDER — GADOBENATE DIMEGLUMINE 529 MG/ML IV SOLN
20.0000 mL | Freq: Once | INTRAVENOUS | Status: AC | PRN
  Administered 2021-04-16: 20 mL via INTRAVENOUS

## 2021-04-18 ENCOUNTER — Telehealth: Payer: Self-pay | Admitting: Diagnostic Neuroimaging

## 2021-04-18 ENCOUNTER — Other Ambulatory Visit: Payer: Self-pay

## 2021-04-18 ENCOUNTER — Encounter (HOSPITAL_BASED_OUTPATIENT_CLINIC_OR_DEPARTMENT_OTHER): Payer: Self-pay | Admitting: Plastic Surgery

## 2021-04-18 NOTE — Telephone Encounter (Signed)
Called patient and informed her the scans are overall okay. She will proceed with her surgery next week, discuss in detail in next FU. Patient verbalized understanding, appreciation.

## 2021-04-18 NOTE — Telephone Encounter (Signed)
Pt is asking if she can get a call with the results to her MRI as soon as possible

## 2021-04-18 NOTE — Telephone Encounter (Signed)
Overall scans ok. -VRP

## 2021-04-22 ENCOUNTER — Other Ambulatory Visit (HOSPITAL_COMMUNITY)
Admission: RE | Admit: 2021-04-22 | Discharge: 2021-04-22 | Disposition: A | Discharge status: Home / Self Care | Payer: BC Managed Care – PPO | Source: Ambulatory Visit | Attending: Plastic Surgery | Admitting: Plastic Surgery

## 2021-04-22 DIAGNOSIS — Z20822 Contact with and (suspected) exposure to covid-19: Secondary | ICD-10-CM | POA: Insufficient documentation

## 2021-04-22 DIAGNOSIS — N62 Hypertrophy of breast: Secondary | ICD-10-CM | POA: Diagnosis not present

## 2021-04-22 DIAGNOSIS — Z79899 Other long term (current) drug therapy: Secondary | ICD-10-CM | POA: Diagnosis not present

## 2021-04-22 DIAGNOSIS — Z01812 Encounter for preprocedural laboratory examination: Secondary | ICD-10-CM | POA: Insufficient documentation

## 2021-04-22 LAB — SARS CORONAVIRUS 2 (TAT 6-24 HRS): SARS Coronavirus 2: NEGATIVE

## 2021-04-25 ENCOUNTER — Ambulatory Visit (HOSPITAL_BASED_OUTPATIENT_CLINIC_OR_DEPARTMENT_OTHER): Payer: BC Managed Care – PPO | Admitting: Anesthesiology

## 2021-04-25 ENCOUNTER — Ambulatory Visit (HOSPITAL_BASED_OUTPATIENT_CLINIC_OR_DEPARTMENT_OTHER)
Admission: RE | Admit: 2021-04-25 | Discharge: 2021-04-25 | Disposition: A | Discharge status: Home / Self Care | Payer: BC Managed Care – PPO | Attending: Plastic Surgery | Admitting: Plastic Surgery

## 2021-04-25 ENCOUNTER — Encounter (HOSPITAL_BASED_OUTPATIENT_CLINIC_OR_DEPARTMENT_OTHER)
Admission: RE | Disposition: A | Discharge status: Home / Self Care | Payer: Self-pay | Source: Home / Self Care | Attending: Plastic Surgery

## 2021-04-25 ENCOUNTER — Other Ambulatory Visit: Payer: Self-pay

## 2021-04-25 ENCOUNTER — Encounter (HOSPITAL_BASED_OUTPATIENT_CLINIC_OR_DEPARTMENT_OTHER): Payer: Self-pay | Admitting: Plastic Surgery

## 2021-04-25 DIAGNOSIS — Z20822 Contact with and (suspected) exposure to covid-19: Secondary | ICD-10-CM | POA: Diagnosis not present

## 2021-04-25 DIAGNOSIS — M549 Dorsalgia, unspecified: Secondary | ICD-10-CM | POA: Diagnosis not present

## 2021-04-25 DIAGNOSIS — N62 Hypertrophy of breast: Secondary | ICD-10-CM | POA: Diagnosis not present

## 2021-04-25 DIAGNOSIS — M542 Cervicalgia: Secondary | ICD-10-CM | POA: Diagnosis not present

## 2021-04-25 DIAGNOSIS — Z79899 Other long term (current) drug therapy: Secondary | ICD-10-CM | POA: Insufficient documentation

## 2021-04-25 DIAGNOSIS — N72 Inflammatory disease of cervix uteri: Secondary | ICD-10-CM | POA: Diagnosis not present

## 2021-04-25 DIAGNOSIS — M546 Pain in thoracic spine: Secondary | ICD-10-CM | POA: Diagnosis not present

## 2021-04-25 DIAGNOSIS — I10 Essential (primary) hypertension: Secondary | ICD-10-CM | POA: Diagnosis not present

## 2021-04-25 DIAGNOSIS — G8929 Other chronic pain: Secondary | ICD-10-CM | POA: Diagnosis not present

## 2021-04-25 HISTORY — PX: BREAST REDUCTION SURGERY: SHX8

## 2021-04-25 SURGERY — BREAST REDUCTION WITH LIPOSUCTION
Anesthesia: General | Site: Breast | Laterality: Bilateral

## 2021-04-25 MED ORDER — ONDANSETRON HCL 4 MG/2ML IJ SOLN
INTRAMUSCULAR | Status: AC
  Filled 2021-04-25: qty 2

## 2021-04-25 MED ORDER — LIDOCAINE 2% (20 MG/ML) 5 ML SYRINGE
INTRAMUSCULAR | Status: AC
  Filled 2021-04-25: qty 5

## 2021-04-25 MED ORDER — PROPOFOL 500 MG/50ML IV EMUL
INTRAVENOUS | Status: AC
  Filled 2021-04-25: qty 50

## 2021-04-25 MED ORDER — OXYCODONE HCL 5 MG/5ML PO SOLN: 5.0000 mg | Freq: Once | ORAL | Status: DC | PRN

## 2021-04-25 MED ORDER — LIDOCAINE-EPINEPHRINE 1 %-1:100000 IJ SOLN
INTRAMUSCULAR | Status: DC | PRN
  Administered 2021-04-25: 20 mL

## 2021-04-25 MED ORDER — OXYCODONE HCL 5 MG PO TABS: 5.0000 mg | ORAL_TABLET | Freq: Once | ORAL | Status: DC | PRN

## 2021-04-25 MED ORDER — ROCURONIUM BROMIDE 100 MG/10ML IV SOLN
INTRAVENOUS | Status: DC | PRN
  Administered 2021-04-25: 70 mg via INTRAVENOUS

## 2021-04-25 MED ORDER — BUPIVACAINE HCL (PF) 0.25 % IJ SOLN
INTRAMUSCULAR | Status: DC | PRN
  Administered 2021-04-25: 30 mL

## 2021-04-25 MED ORDER — SODIUM CHLORIDE 0.9 % IV SOLN: 250.0000 mL | INTRAVENOUS | Status: DC | PRN

## 2021-04-25 MED ORDER — HYDROMORPHONE HCL 1 MG/ML IJ SOLN
0.2500 mg | INTRAMUSCULAR | Status: DC | PRN
  Administered 2021-04-25: 0.5 mg via INTRAVENOUS

## 2021-04-25 MED ORDER — FENTANYL CITRATE (PF) 100 MCG/2ML IJ SOLN
25.0000 ug | INTRAMUSCULAR | Status: DC | PRN
  Administered 2021-04-25: 25 ug via INTRAVENOUS

## 2021-04-25 MED ORDER — CEFAZOLIN SODIUM-DEXTROSE 2-4 GM/100ML-% IV SOLN
INTRAVENOUS | Status: AC
  Filled 2021-04-25: qty 100

## 2021-04-25 MED ORDER — MIDAZOLAM HCL 2 MG/2ML IJ SOLN
INTRAMUSCULAR | Status: AC
  Filled 2021-04-25: qty 2

## 2021-04-25 MED ORDER — FENTANYL CITRATE (PF) 100 MCG/2ML IJ SOLN
INTRAMUSCULAR | Status: AC
  Filled 2021-04-25: qty 2

## 2021-04-25 MED ORDER — AMISULPRIDE (ANTIEMETIC) 5 MG/2ML IV SOLN
INTRAVENOUS | Status: AC
  Filled 2021-04-25: qty 4

## 2021-04-25 MED ORDER — CHLORHEXIDINE GLUCONATE CLOTH 2 % EX PADS: 6.0000 | MEDICATED_PAD | Freq: Once | CUTANEOUS | Status: DC

## 2021-04-25 MED ORDER — DEXAMETHASONE SODIUM PHOSPHATE 4 MG/ML IJ SOLN
INTRAMUSCULAR | Status: DC | PRN
  Administered 2021-04-25: 5 mg via INTRAVENOUS

## 2021-04-25 MED ORDER — CEFAZOLIN SODIUM-DEXTROSE 2-4 GM/100ML-% IV SOLN
2.0000 g | INTRAVENOUS | Status: AC
  Administered 2021-04-25: 2 g via INTRAVENOUS

## 2021-04-25 MED ORDER — PROMETHAZINE HCL 25 MG/ML IJ SOLN: 6.2500 mg | INTRAMUSCULAR | Status: DC | PRN

## 2021-04-25 MED ORDER — PROPOFOL 10 MG/ML IV BOLUS
INTRAVENOUS | Status: DC | PRN
  Administered 2021-04-25: 140 mg via INTRAVENOUS

## 2021-04-25 MED ORDER — PHENYLEPHRINE HCL (PRESSORS) 10 MG/ML IV SOLN
INTRAVENOUS | Status: DC | PRN
  Administered 2021-04-25: 80 ug via INTRAVENOUS

## 2021-04-25 MED ORDER — LIDOCAINE HCL 1 % IJ SOLN: INTRAMUSCULAR | Status: DC | PRN

## 2021-04-25 MED ORDER — MEPERIDINE HCL 25 MG/ML IJ SOLN: 6.2500 mg | INTRAMUSCULAR | Status: DC | PRN

## 2021-04-25 MED ORDER — HYDROMORPHONE HCL 1 MG/ML IJ SOLN
INTRAMUSCULAR | Status: AC
  Filled 2021-04-25: qty 0.5

## 2021-04-25 MED ORDER — FENTANYL CITRATE (PF) 100 MCG/2ML IJ SOLN
INTRAMUSCULAR | Status: DC | PRN
  Administered 2021-04-25: 50 ug via INTRAVENOUS
  Administered 2021-04-25: 100 ug via INTRAVENOUS

## 2021-04-25 MED ORDER — LIDOCAINE HCL (CARDIAC) PF 100 MG/5ML IV SOSY
PREFILLED_SYRINGE | INTRAVENOUS | Status: DC | PRN
  Administered 2021-04-25: 60 mg via INTRAVENOUS

## 2021-04-25 MED ORDER — AMISULPRIDE (ANTIEMETIC) 5 MG/2ML IV SOLN
10.0000 mg | Freq: Once | INTRAVENOUS | Status: AC | PRN
  Administered 2021-04-25: 10 mg via INTRAVENOUS

## 2021-04-25 MED ORDER — OXYCODONE HCL 5 MG PO TABS: 5.0000 mg | ORAL_TABLET | ORAL | Status: DC | PRN

## 2021-04-25 MED ORDER — LACTATED RINGERS IV SOLN: INTRAVENOUS | Status: DC

## 2021-04-25 MED ORDER — SUGAMMADEX SODIUM 200 MG/2ML IV SOLN
INTRAVENOUS | Status: DC | PRN
  Administered 2021-04-25: 200 mg via INTRAVENOUS

## 2021-04-25 MED ORDER — MIDAZOLAM HCL 5 MG/5ML IJ SOLN
INTRAMUSCULAR | Status: DC | PRN
  Administered 2021-04-25: 2 mg via INTRAVENOUS

## 2021-04-25 MED ORDER — LIDOCAINE HCL 1 % IJ SOLN
INTRAVENOUS | Status: DC | PRN
  Administered 2021-04-25: 300 mL

## 2021-04-25 MED ORDER — DEXAMETHASONE SODIUM PHOSPHATE 10 MG/ML IJ SOLN
INTRAMUSCULAR | Status: AC
  Filled 2021-04-25: qty 1

## 2021-04-25 MED ORDER — ACETAMINOPHEN 325 MG PO TABS: 650.0000 mg | ORAL_TABLET | ORAL | Status: DC | PRN

## 2021-04-25 MED ORDER — SODIUM CHLORIDE 0.9% FLUSH: 3.0000 mL | INTRAVENOUS | Status: DC | PRN

## 2021-04-25 MED ORDER — SODIUM CHLORIDE 0.9% FLUSH: 3.0000 mL | Freq: Two times a day (BID) | INTRAVENOUS | Status: DC

## 2021-04-25 MED ORDER — ACETAMINOPHEN 325 MG RE SUPP: 650.0000 mg | RECTAL | Status: DC | PRN

## 2021-04-25 MED ORDER — ONDANSETRON HCL 4 MG/2ML IJ SOLN
INTRAMUSCULAR | Status: DC | PRN
  Administered 2021-04-25: 4 mg via INTRAVENOUS

## 2021-04-25 MED ORDER — EPHEDRINE SULFATE 50 MG/ML IJ SOLN
INTRAMUSCULAR | Status: DC | PRN
  Administered 2021-04-25 (×4): 10 mg via INTRAVENOUS

## 2021-04-25 SURGICAL SUPPLY — 72 items
ADH SKN CLS APL DERMABOND .7 (GAUZE/BANDAGES/DRESSINGS) ×4
BAG DECANTER FOR FLEXI CONT (MISCELLANEOUS) IMPLANT
BINDER BREAST LRG (GAUZE/BANDAGES/DRESSINGS) ×2 IMPLANT
BINDER BREAST MEDIUM (GAUZE/BANDAGES/DRESSINGS) IMPLANT
BINDER BREAST XLRG (GAUZE/BANDAGES/DRESSINGS) IMPLANT
BINDER BREAST XXLRG (GAUZE/BANDAGES/DRESSINGS) IMPLANT
BIOPATCH RED 1 DISK 7.0 (GAUZE/BANDAGES/DRESSINGS) IMPLANT
BLADE HEX COATED 2.75 (ELECTRODE) ×2 IMPLANT
BLADE KNIFE PERSONA 10 (BLADE) ×4 IMPLANT
BLADE SURG 15 STRL LF DISP TIS (BLADE) ×1 IMPLANT
BLADE SURG 15 STRL SS (BLADE) ×2
BNDG GAUZE ELAST 4 BULKY (GAUZE/BANDAGES/DRESSINGS) IMPLANT
CANISTER SUCT 1200ML W/VALVE (MISCELLANEOUS) ×2 IMPLANT
COVER BACK TABLE 60X90IN (DRAPES) ×2 IMPLANT
COVER MAYO STAND STRL (DRAPES) ×2 IMPLANT
COVER WAND RF STERILE (DRAPES) IMPLANT
DECANTER SPIKE VIAL GLASS SM (MISCELLANEOUS) ×2 IMPLANT
DERMABOND ADVANCED (GAUZE/BANDAGES/DRESSINGS) ×4
DERMABOND ADVANCED .7 DNX12 (GAUZE/BANDAGES/DRESSINGS) ×4 IMPLANT
DRAIN CHANNEL 19F RND (DRAIN) IMPLANT
DRAPE LAPAROSCOPIC ABDOMINAL (DRAPES) ×2 IMPLANT
DRSG AQUACEL AG ADV 3.5X10 (GAUZE/BANDAGES/DRESSINGS) ×4 IMPLANT
DRSG OPSITE POSTOP 4X6 (GAUZE/BANDAGES/DRESSINGS) IMPLANT
DRSG PAD ABDOMINAL 8X10 ST (GAUZE/BANDAGES/DRESSINGS) ×6 IMPLANT
ELECT BLADE 4.0 EZ CLEAN MEGAD (MISCELLANEOUS) ×2
ELECT REM PT RETURN 9FT ADLT (ELECTROSURGICAL) ×2
ELECTRODE BLDE 4.0 EZ CLN MEGD (MISCELLANEOUS) ×1 IMPLANT
ELECTRODE REM PT RTRN 9FT ADLT (ELECTROSURGICAL) ×1 IMPLANT
EVACUATOR SILICONE 100CC (DRAIN) IMPLANT
GLOVE SURG ENC MOIS LTX SZ6.5 (GLOVE) ×8 IMPLANT
GLOVE SURG ENC MOIS LTX SZ7.5 (GLOVE) ×1 IMPLANT
GLOVE SURG ENC MOIS LTX SZ8 (GLOVE) ×2 IMPLANT
GOWN STRL REUS TWL 2XL XL LVL4 (GOWN DISPOSABLE) ×2 IMPLANT
GOWN STRL REUS W/ TWL LRG LVL3 (GOWN DISPOSABLE) ×2 IMPLANT
GOWN STRL REUS W/TWL LRG LVL3 (GOWN DISPOSABLE) ×4
NDL FILTER BLUNT 18X1 1/2 (NEEDLE) IMPLANT
NDL SAFETY ECLIPSE 18X1.5 (NEEDLE) IMPLANT
NEEDLE FILTER BLUNT 18X 1/2SAF (NEEDLE) ×1
NEEDLE FILTER BLUNT 18X1 1/2 (NEEDLE) ×1 IMPLANT
NEEDLE HYPO 18GX1.5 SHARP (NEEDLE)
NEEDLE HYPO 25X1 1.5 SAFETY (NEEDLE) ×2 IMPLANT
NS IRRIG 1000ML POUR BTL (IV SOLUTION) ×2 IMPLANT
PACK BASIN DAY SURGERY FS (CUSTOM PROCEDURE TRAY) ×2 IMPLANT
PAD ALCOHOL SWAB (MISCELLANEOUS) IMPLANT
PAD FOAM SILICONE BACKED (GAUZE/BANDAGES/DRESSINGS) IMPLANT
PENCIL SMOKE EVACUATOR (MISCELLANEOUS) ×2 IMPLANT
PIN SAFETY STERILE (MISCELLANEOUS) IMPLANT
SLEEVE SCD COMPRESS KNEE MED (STOCKING) ×2 IMPLANT
SPONGE LAP 18X18 RF (DISPOSABLE) ×4 IMPLANT
STRIP SUTURE WOUND CLOSURE 1/2 (MISCELLANEOUS) ×8 IMPLANT
SUT MNCRL AB 4-0 PS2 18 (SUTURE) ×5 IMPLANT
SUT MON AB 3-0 SH 27 (SUTURE) ×8
SUT MON AB 3-0 SH27 (SUTURE) ×1 IMPLANT
SUT MON AB 5-0 PS2 18 (SUTURE) ×2 IMPLANT
SUT PDS 3-0 CT2 (SUTURE) ×2
SUT PDS AB 2-0 CT2 27 (SUTURE) IMPLANT
SUT PDS II 3-0 CT2 27 ABS (SUTURE) ×1 IMPLANT
SUT SILK 3 0 PS 1 (SUTURE) IMPLANT
SUT VIC AB 3-0 SH 27 (SUTURE)
SUT VIC AB 3-0 SH 27X BRD (SUTURE) IMPLANT
SUT VICRYL 4-0 PS2 18IN ABS (SUTURE) IMPLANT
SYR 50ML LL SCALE MARK (SYRINGE) IMPLANT
SYR BULB IRRIG 60ML STRL (SYRINGE) ×4 IMPLANT
SYR CONTROL 10ML LL (SYRINGE) ×2 IMPLANT
TAPE MEASURE VINYL STERILE (MISCELLANEOUS) IMPLANT
TOWEL GREEN STERILE FF (TOWEL DISPOSABLE) ×4 IMPLANT
TRAY DSU PREP LF (CUSTOM PROCEDURE TRAY) ×2 IMPLANT
TUBE CONNECTING 20X1/4 (TUBING) ×2 IMPLANT
TUBING INFILTRATION IT-10001 (TUBING) ×1 IMPLANT
TUBING SET GRADUATE ASPIR 12FT (MISCELLANEOUS) ×2 IMPLANT
UNDERPAD 30X36 HEAVY ABSORB (UNDERPADS AND DIAPERS) ×4 IMPLANT
YANKAUER SUCT BULB TIP NO VENT (SUCTIONS) ×2 IMPLANT

## 2021-04-25 NOTE — Op Note (Addendum)
Breast Reduction Op note:    DATE OF PROCEDURE: 04/25/2021  LOCATION: [facility]  SURGEON: Lyndee Leo Sanger Lavoy Bernards, DO  ASSISTANT: Roetta Sessions, PA  PREOPERATIVE DIAGNOSIS 1. Macromastia 2. Neck Pain 3. Back Pain  POSTOPERATIVE DIAGNOSIS 1. Macromastia 2. Neck Pain 3. Back Pain  PROCEDURES 1. Bilateral breast reduction.  Right reduction 410 g, Left reduction 751 g  COMPLICATIONS: None.  DRAINS: none  INDICATIONS FOR PROCEDURE Megan Castillo is a 55 y.o. year-old female born on May 21, 1966,with a history of symptomatic macromastia with concominant back pain, neck pain, shoulder grooving from her bra.   MRN: [id number]  CONSENT Informed consent was obtained directly from the patient. The risks, benefits and alternatives were fully discussed. Specific risks including but not limited to bleeding, infection, hematoma, seroma, scarring, pain, nipple necrosis, asymmetry, poor cosmetic results, and need for further surgery were discussed. The patient had ample opportunity to have her questions answered to her satisfaction.  DESCRIPTION OF PROCEDURE  Patient was brought into the operating room and placed in a supine position.  SCDs were placed and appropriate padding was performed.  Antibiotics were given. The patient underwent general anesthesia and the chest was prepped and draped in a sterile fashion.  A timeout was performed and all information was confirmed to be correct.  Right side: Preoperative markings were confirmed.  Incision lines were injected with local with epinephrine.  After waiting for vasoconstriction, the marked lines were incised. Tumescent was placed in the lateral and inferior aspect of both breasts. Liposuction was performed laterally.  A Wise-pattern superomedial breast reduction was performed by de-epithelializing the pedicle, using bovie to create the superomedial pedicle, and removing breast tissue from the lateral and inferior  portions of the breast.  Care was taken to not undermine the breast pedicle. Hemostasis was achieved.  The nipple was gently rotated into position and the soft tissue closed with 4-0 Monocryl.   The pocket was irrigated and hemostasis confirmed.  The deep tissues were approximated with 3-0 PDS and Monocryl sutures and the skin was closed with deep dermal and subcuticular 4-0 Monocryl sutures.  The nipple and skin flaps had good capillary refill at the end of the procedure.    Left side: Preoperative markings were confirmed.  Incision lines were injected with local with epinephrine.  After waiting for vasoconstriction, the marked lines were incised. Liposuction was performed laterally.  A Wise-pattern superomedial breast reduction was performed by de-epithelializing the pedicle, using bovie to create the superomedial pedicle, and removing breast tissue from the lateral and inferior portions of the breast.  Care was taken to not undermine the breast pedicle. Hemostasis was achieved.  The nipple was gently rotated into position and the soft tissue was closed with 4-0 Monocryl.  The patient was sat upright and size and shape symmetry was confirmed.  The pocket was irrigated and hemostasis confirmed.  The deep tissues were approximated with 3-0 PDS and Monocryl sutures and the skin was closed with deep dermal and subcuticular 4-0 Monocryl sutures.  Dermabond was applied.  A breast binder and ABDs were placed.  The nipple and skin flaps had good capillary refill at the end of the procedure.  The patient tolerated the procedure well. The patient was allowed to wake from anesthesia and taken to the recovery room in satisfactory condition.  The advanced practice practitioner (APP) assisted throughout the case.  The APP was essential in retraction and counter traction when needed to make the case progress smoothly.  This  retraction and assistance made it possible to see the tissue plans for the procedure.  The assistance  was needed for blood control, tissue re-approximation and assisted with closure of the incision site.

## 2021-04-25 NOTE — Anesthesia Postprocedure Evaluation (Signed)
Anesthesia Post Note  Patient: Megan Castillo  Procedure(s) Performed: BREAST REDUCTION WITH LIPOSUCTION (Bilateral Breast)     Patient location during evaluation: PACU Anesthesia Type: General Level of consciousness: awake and alert Pain management: pain level controlled Vital Signs Assessment: post-procedure vital signs reviewed and stable Respiratory status: spontaneous breathing, nonlabored ventilation and respiratory function stable Cardiovascular status: blood pressure returned to baseline and stable Postop Assessment: no apparent nausea or vomiting Anesthetic complications: no   No complications documented.  Last Vitals:  Vitals:   04/25/21 1030 04/25/21 1104  BP: 131/84 (!) 153/97  Pulse: 78 80  Resp: 17 16  Temp:  36.5 C  SpO2: 95% 94%    Last Pain:  Vitals:   04/25/21 1045  TempSrc:   PainSc: 0-No pain                 Lynda Rainwater

## 2021-04-25 NOTE — Transfer of Care (Signed)
Immediate Anesthesia Transfer of Care Note  Patient: Megan Castillo  Procedure(s) Performed: BREAST REDUCTION WITH LIPOSUCTION (Bilateral Breast)  Patient Location: PACU  Anesthesia Type:General  Level of Consciousness: sedated  Airway & Oxygen Therapy: Patient Spontanous Breathing and Patient connected to face mask oxygen  Post-op Assessment: Report given to RN and Post -op Vital signs reviewed and stable  Post vital signs: Reviewed and stable  Last Vitals:  Vitals Value Taken Time  BP 163/91 04/25/21 0937  Temp    Pulse 79 04/25/21 0940  Resp 14 04/25/21 0940  SpO2 99 % 04/25/21 0940  Vitals shown include unvalidated device data.  Last Pain:  Vitals:   04/25/21 0635  TempSrc: Oral  PainSc: 0-No pain      Patients Stated Pain Goal: 3 (17/00/17 4944)  Complications: No complications documented.

## 2021-04-25 NOTE — Anesthesia Procedure Notes (Signed)
Procedure Name: Intubation Date/Time: 04/25/2021 7:47 AM Performed by: Maryella Shivers, CRNA Pre-anesthesia Checklist: Patient identified, Emergency Drugs available, Suction available and Patient being monitored Patient Re-evaluated:Patient Re-evaluated prior to induction Oxygen Delivery Method: Circle system utilized Preoxygenation: Pre-oxygenation with 100% oxygen Induction Type: IV induction Ventilation: Mask ventilation without difficulty Laryngoscope Size: Mac and 3 Grade View: Grade I Tube type: Oral Tube size: 7.0 mm Number of attempts: 1 Airway Equipment and Method: Stylet and Oral airway Placement Confirmation: ETT inserted through vocal cords under direct vision,  positive ETCO2 and breath sounds checked- equal and bilateral Secured at: 20 cm Tube secured with: Tape Dental Injury: Teeth and Oropharynx as per pre-operative assessment

## 2021-04-25 NOTE — Anesthesia Preprocedure Evaluation (Signed)
Anesthesia Evaluation  Patient identified by MRN, date of birth, ID band Patient awake    Reviewed: Allergy & Precautions, NPO status , Patient's Chart, lab work & pertinent test results  Airway Mallampati: II  TM Distance: >3 FB Neck ROM: Full    Dental no notable dental hx.    Pulmonary neg pulmonary ROS,    Pulmonary exam normal breath sounds clear to auscultation       Cardiovascular hypertension, Normal cardiovascular exam Rhythm:Regular Rate:Normal     Neuro/Psych  Headaches, negative psych ROS   GI/Hepatic Neg liver ROS, GERD  ,  Endo/Other  negative endocrine ROS  Renal/GU negative Renal ROS  negative genitourinary   Musculoskeletal  (+) Arthritis , Osteoarthritis,    Abdominal   Peds negative pediatric ROS (+)  Hematology negative hematology ROS (+)   Anesthesia Other Findings   Reproductive/Obstetrics negative OB ROS                             Anesthesia Physical Anesthesia Plan  ASA: II  Anesthesia Plan: General   Post-op Pain Management:    Induction: Intravenous  PONV Risk Score and Plan: 3 and Ondansetron, Dexamethasone, Midazolam and Treatment may vary due to age or medical condition  Airway Management Planned: Oral ETT  Additional Equipment:   Intra-op Plan:   Post-operative Plan: Extubation in OR  Informed Consent: I have reviewed the patients History and Physical, chart, labs and discussed the procedure including the risks, benefits and alternatives for the proposed anesthesia with the patient or authorized representative who has indicated his/her understanding and acceptance.     Dental advisory given  Plan Discussed with: CRNA  Anesthesia Plan Comments:         Anesthesia Quick Evaluation

## 2021-04-25 NOTE — Discharge Instructions (Addendum)

## 2021-04-25 NOTE — Interval H&P Note (Signed)
History and Physical Interval Note:  04/25/2021 7:04 AM  Megan Castillo  has presented today for surgery, with the diagnosis of mammary hypertrophy.  The various methods of treatment have been discussed with the patient and family. After consideration of risks, benefits and other options for treatment, the patient has consented to  Procedure(s) with comments: BREAST REDUCTION WITH LIPOSUCTION (Bilateral) - 3 hours as a surgical intervention.  The patient's history has been reviewed, patient examined, no change in status, stable for surgery.  I have reviewed the patient's chart and labs.  Questions were answered to the patient's satisfaction.     Loel Lofty Julyan Gales

## 2021-04-26 LAB — SURGICAL PATHOLOGY

## 2021-04-29 ENCOUNTER — Encounter (HOSPITAL_BASED_OUTPATIENT_CLINIC_OR_DEPARTMENT_OTHER): Payer: Self-pay | Admitting: Plastic Surgery

## 2021-05-03 ENCOUNTER — Ambulatory Visit (INDEPENDENT_AMBULATORY_CARE_PROVIDER_SITE_OTHER): Payer: BC Managed Care – PPO | Admitting: Plastic Surgery

## 2021-05-03 ENCOUNTER — Encounter: Payer: Self-pay | Admitting: Plastic Surgery

## 2021-05-03 ENCOUNTER — Other Ambulatory Visit: Payer: Self-pay

## 2021-05-03 DIAGNOSIS — N62 Hypertrophy of breast: Secondary | ICD-10-CM

## 2021-05-03 NOTE — Progress Notes (Signed)
The patient is a 55 year old female here for follow-up on her breast reduction.  She is so far pleased.  There is no sign of a seroma or a hematoma.  The skin incisions appear to be healing nicely.  The Aquacel dressing is in place.  Her pain is well controlled.  She is fine to go into a sports bra and shower.  To see her back in 2 weeks

## 2021-05-07 ENCOUNTER — Encounter: Payer: Self-pay | Admitting: Diagnostic Neuroimaging

## 2021-05-07 ENCOUNTER — Ambulatory Visit (INDEPENDENT_AMBULATORY_CARE_PROVIDER_SITE_OTHER): Payer: BC Managed Care – PPO | Admitting: Diagnostic Neuroimaging

## 2021-05-07 VITALS — BP 131/83 | HR 69 | Ht 64.0 in | Wt 163.0 lb

## 2021-05-07 DIAGNOSIS — H02402 Unspecified ptosis of left eyelid: Secondary | ICD-10-CM

## 2021-05-07 DIAGNOSIS — G43109 Migraine with aura, not intractable, without status migrainosus: Secondary | ICD-10-CM

## 2021-05-07 NOTE — Progress Notes (Signed)
[facility]  PATIENT: Megan Castillo DOB: [birth year]  REFERRING CLINICIAN:  HISTORY FROM: patient  REASON FOR VISIT: follow up    HISTORICAL  CHIEF COMPLAINT:  Chief Complaint  Patient presents with  . Migraine    "weather triggers them but manageable, Sumatriptan takes them away"  . Ptosis    Rm 7, 6 month FU    HISTORY OF PRESENT ILLNESS:   UPDATE (05/07/21, VRP): Since last visit, doing well. Symptoms are stable. HA 2-3 per month. Tolerating sumatriptan 50mg  as needed (breaks 100mg  capsule in half). Left ptosis stable. Saw ophthalmology and no specific cause found.  UPDATE (10/10/20, VRP): Since last visit, doing well. Symptoms are stable. 2 HA per month. Left ptosis (noted in 2018 ID photo) slightly more noticeable. No alleviating or aggravating factors.  UPDATE (10/10/19, VRP): Since last visit, now with HA every 4-6 weeks. Sumatriptan helps, but takes a few hours to help. No alleviating or aggravating factors. Tolerating sumatriptan.    UPDATE (10/04/18, VRP): Since last visit, doing well. Symptoms are mild. Severity is mild. No alleviating or aggravating factors. Tolerating sumatriptan 50.  Avg 1 HA every 6 months.  UPDATE (12/02/17, VRP): Since last visit, doing well for several months; 1 HA every 2-3 months. Then in Oct 2018 had 1 HA per week. Tolerating sumatriptan helps. No new alleviating or aggravating factors.   UPDATE 08/05/16: Since last visit, doing well with headaches (1 every month or other month). Sumatriptan 50mg  daily well. Still with high levels of stress (husband just d/c'd from hospital today for MI).   PRIOR HPI (03/03/16): 55 year old female here for evaluation of headaches. For past 3-4 years patient has had onset of unilateral, sometimes bilateral, periorbital severe intense headache and pain. Patient typically lays down, turns down the lights, uses warm washcloth to help relieve symptoms. Patient feels tired and foggy after headaches.  Sometimes she has decreased peripheral vision and scotoma during the headaches. Patient has some sensitivity to light. No nausea or vomiting. She has some dizziness. She has some sinus pressure. Patient thought she had sinus headaches for many years and went to ENT recently for evaluation. Patient was told she may have migraine headaches and referred to me for further evaluation. Patient having approximately 2-4 days of migraine headache per month. Triggering factors include change of weather, change of season, decreased sleep. Patient tried sumatriptan in the past with mild relief.   REVIEW OF SYSTEMS: Full 14 system review of systems performed and negative with exception of: as per HPI.    ALLERGIES: Allergies  Allergen Reactions  . Bee Pollen Rash    HOME MEDICATIONS: Outpatient Medications Prior to Visit  Medication Sig Dispense Refill  . ondansetron (ZOFRAN) 4 MG tablet Take 1 tablet (4 mg total) by mouth every 8 (eight) hours as needed for nausea or vomiting. 20 tablet 0  . rosuvastatin (CRESTOR) 10 MG tablet Take 10 mg by mouth daily.    . SUMAtriptan (IMITREX) 100 MG tablet take 1 tablet by mouth if needed for migraines; MAY REPEAT 1 AFTER 2 HOURS; 2 PER DAY OR 8 PER MONTH 8 tablet 12   No facility-administered medications prior to visit.    PAST MEDICAL HISTORY: Past Medical History:  Diagnosis Date  . Arthritis   . Constipation   . Hemorrhoids   . Migraine   . Rectal pain     PAST SURGICAL HISTORY: Past Surgical History:  Procedure Laterality Date  . ABDOMINAL HYSTERECTOMY  11/2009  .  BREAST REDUCTION SURGERY Bilateral 04/25/2021   Procedure: BREAST REDUCTION WITH LIPOSUCTION;  Surgeon: Wallace Going, DO;  Location: [facility];  Service: Plastics;  Laterality: Bilateral;  3 hours  . HEMORRHOID SURGERY    . NASAL RECONSTRUCTION      FAMILY HISTORY: Family History  Problem Relation Age of Onset  . Heart disease Mother   . Cancer Brother 9        Burkett lymphoma    SOCIAL HISTORY:  Social History   Socioeconomic History  . Marital status: Married    Spouse name: Elta Guadeloupe  . Number of children: 2  . Years of education: 49 1/2  . Highest education level: Not on file  Occupational History    Comment: Systems Engineering  Tobacco Use  . Smoking status: Never Smoker  . Smokeless tobacco: Never Used  Substance and Sexual Activity  . Alcohol use: No  . Drug use: No  . Sexual activity: Yes  Other Topics Concern  . Not on file  Social History Narrative   Lives at home with husband   Caffeine use-tea twice a day   Social Determinants of Health   Financial Resource Strain: Not on file  Food Insecurity: Not on file  Transportation Needs: Not on file  Physical Activity: Not on file  Stress: Not on file  Social Connections: Not on file  Intimate Partner Violence: Not on file     PHYSICAL EXAM  GENERAL EXAM/CONSTITUTIONAL: Vitals:  Vitals:   05/07/21 0813  BP: 131/83  Pulse: 69  Weight: 163 lb (73.9 kg)  Height: 5\' 4"  (1.626 m)   Body mass index is 27.98 kg/m. No exam data present  Patient is in no distress; well developed, nourished and groomed; neck is supple  CARDIOVASCULAR:  Examination of carotid arteries is normal; no carotid bruits  Regular rate and rhythm, no murmurs  Examination of peripheral vascular system by observation and palpation is normal  EYES:  Ophthalmoscopic exam of optic discs and posterior segments is normal; no papilledema or hemorrhages  MUSCULOSKELETAL:  Gait, strength, tone, movements noted in Neurologic exam below  NEUROLOGIC: MENTAL STATUS:  No flowsheet data found.  awake, alert, oriented to person, place and time  recent and remote memory intact  normal attention and concentration  language fluent, comprehension intact, naming intact,   fund of knowledge appropriate  CRANIAL NERVE:   2nd - no papilledema on fundoscopic exam  2nd, 3rd, 4th, 6th - pupils  reactive to light; visual fields full to confrontation, extraocular muscles intact, no nystagmus; MILD LEFT PTOSIS  5th - facial sensation symmetric  7th - facial strength symmetric  8th - hearing intact  9th - palate elevates symmetrically, uvula midline  11th - shoulder shrug symmetric  12th - tongue protrusion midline  MOTOR:   normal bulk and tone, full strength in the BUE, BLE  SENSORY:   normal and symmetric to light touch  COORDINATION:   finger-nose-finger, fine finger movements normal  REFLEXES:   deep tendon reflexes present and symmetric  GAIT/STATION:   narrow based gait    DIAGNOSTIC DATA (LABS, IMAGING, TESTING) - I reviewed patient records, labs, notes, testing and imaging myself where available.  Lab Results  Component Value Date   WBC 8.3 11/01/2018   HGB 14.0 11/01/2018   HCT 44.7 11/01/2018   MCV 92.2 11/01/2018   PLT 214 11/01/2018      Component Value Date/Time   NA 141 11/01/2018 1858   K 3.5 11/01/2018  1858   CL 107 11/01/2018 1858   CO2 21 (L) 11/01/2018 1858   GLUCOSE 93 11/01/2018 1858   BUN 10 11/01/2018 1858   CREATININE 0.83 11/01/2018 1858   CREATININE 0.70 04/10/2013 1702   CALCIUM 9.2 11/01/2018 1858   PROT 7.4 04/10/2013 1702   ALBUMIN 4.5 04/10/2013 1702   AST 28 04/10/2013 1702   ALT 28 04/10/2013 1702   ALKPHOS 80 04/10/2013 1702   BILITOT 0.3 04/10/2013 1702   GFRNONAA >60 11/01/2018 1858   GFRAA >60 11/01/2018 1858   No results found for: CHOL, HDL, LDLCALC, LDLDIRECT, TRIG, CHOLHDL No results found for: HGBA1C No results found for: VITAMINB12 Lab Results  Component Value Date   TSH 3.320 10/10/2020     02/09/13 MRI brain (with and without contrast) 1. Few punctate subcortical, periventricular and juxtacortical foci of gliosis. These findings are non-specific and considerations include autoimmune, inflammatory, post-infectious, microvascular ischemic or migraine associated etiologies.  2. No abnormal  enhancing lesions.  04/16/21 MRI brain, MRA head / neck - unremarkable  10/10/20  Ref Range & Units 6 mo ago   AChR Binding Ab, Serum 0.00 - 0.24 nmol/L <0.03   Comment:                Negative:  0.00 - 0.24                 Borderline: 0.25 - 0.40                 Positive:     >0.40    10/10/20   Ref Range & Units 6 mo ago  MuSK Antibodies U/mL <1.0   Comment:   Reference Range:   Negative: <1.0   Positive: 1.0 or higher       ASSESSMENT AND PLAN  55 y.o. year old female here with headaches / migraine features. Symptoms started around 2013.  Meds tried: sumatriptan   Dx:  1. Migraine with aura and without status migrainosus, not intractable   2. 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    Penni Bombard, MD 1/00/7121, 9:75 AM Certified in Neurology, Neurophysiology and Neuroimaging  [facility] [address] [place], [place] [address] [phone]

## 2021-05-08 ENCOUNTER — Telehealth: Payer: Self-pay

## 2021-05-08 NOTE — Telephone Encounter (Signed)
Returned patients call. The honeycomb dressing is beginning to peel some. A little bit of water is coming in the bandage, but not a lot. Advised her that Dr. Marla Roe prefers the dressing to stay on for 2 weeks. She does not feel comfortable removing the bandage off at this time, and will wait until her appointment this Friday.

## 2021-05-08 NOTE — Telephone Encounter (Signed)
Patient had a breast reduction on 04/25/2021 and she said that her honeycomb tape is starting to peel off.  She would like to know if she can take it off, or if she needs to wait until her appointment on Friday.  Please call.

## 2021-05-10 ENCOUNTER — Other Ambulatory Visit: Payer: Self-pay

## 2021-05-10 ENCOUNTER — Ambulatory Visit (INDEPENDENT_AMBULATORY_CARE_PROVIDER_SITE_OTHER): Payer: BC Managed Care – PPO | Admitting: Plastic Surgery

## 2021-05-10 DIAGNOSIS — N62 Hypertrophy of breast: Secondary | ICD-10-CM

## 2021-05-10 NOTE — Progress Notes (Signed)
The patient is a 55 year old female here for follow-up on her breast reduction.  She is very pleased with her results.  She has a little bit of a seroma that is not creating any tension in the skin.  I think that this will resolve on its own.  The dressings were removed today and the Steri-Strips were replaced.  Incisions are all healing well.

## 2021-05-16 DIAGNOSIS — H02422 Myogenic ptosis of left eyelid: Secondary | ICD-10-CM | POA: Diagnosis not present

## 2021-05-16 DIAGNOSIS — H02834 Dermatochalasis of left upper eyelid: Secondary | ICD-10-CM | POA: Diagnosis not present

## 2021-05-16 DIAGNOSIS — H02413 Mechanical ptosis of bilateral eyelids: Secondary | ICD-10-CM | POA: Diagnosis not present

## 2021-05-16 DIAGNOSIS — H02831 Dermatochalasis of right upper eyelid: Secondary | ICD-10-CM | POA: Diagnosis not present

## 2021-05-16 DIAGNOSIS — H57813 Brow ptosis, bilateral: Secondary | ICD-10-CM | POA: Diagnosis not present

## 2021-05-17 DIAGNOSIS — D2339 Other benign neoplasm of skin of other parts of face: Secondary | ICD-10-CM | POA: Diagnosis not present

## 2021-05-17 DIAGNOSIS — B078 Other viral warts: Secondary | ICD-10-CM | POA: Diagnosis not present

## 2021-05-17 DIAGNOSIS — L738 Other specified follicular disorders: Secondary | ICD-10-CM | POA: Diagnosis not present

## 2021-05-17 DIAGNOSIS — L601 Onycholysis: Secondary | ICD-10-CM | POA: Diagnosis not present

## 2021-05-22 ENCOUNTER — Telehealth: Payer: Self-pay | Admitting: Plastic Surgery

## 2021-05-22 NOTE — Telephone Encounter (Signed)
Called and spoke with the patient regarding the message below.  Patient stated she wanted to know how long these the steri strips stay on.  She stated that it's been 4 weeks since her surgery and the strips seem to want to continue to stick to her.    Informed the patient that it's not a set time when the strips come off.  Everyone is different.  With some people the strips come off quicker, and with others the strips seem to want to stay on longer.    Informed her if the end of the strip is coming up she can lift the end up and see if it will continue to come off, if not then leave the strip along.  But the strips will continue to loosen up and come off as they continue to get wet.  Patient verbalized understanding and agreed.//AB/CMA

## 2021-05-22 NOTE — Telephone Encounter (Signed)
Megan Castillo called regarding the bandaging from her reduction 4 weeks ago. She is asking about when to change the strips and the next steps. Please call and advise. [phone]. Thanks!

## 2021-06-04 ENCOUNTER — Ambulatory Visit (INDEPENDENT_AMBULATORY_CARE_PROVIDER_SITE_OTHER): Payer: BC Managed Care – PPO | Admitting: Plastic Surgery

## 2021-06-04 ENCOUNTER — Other Ambulatory Visit: Payer: Self-pay

## 2021-06-04 ENCOUNTER — Encounter: Payer: Self-pay | Admitting: Plastic Surgery

## 2021-06-04 DIAGNOSIS — N62 Hypertrophy of breast: Secondary | ICD-10-CM

## 2021-06-04 NOTE — Progress Notes (Signed)
The patient is a 55 year old female here for follow-up on her breast reduction.  She had a little bit of Dermabond that I was able to clear off the areola area.  No areas of skin breakdown.  Overall she is doing really well.  There looks like there is a little bit of contracture at the inframammary fold and vertical limb.  I have asked her to start massaging those areas on both sides.  I think that will help.  I would like to see her back in a couple of months to make sure everything is softening up.  She should continue with the sports bra on a regular basis and just normal bra for special occasions.  Pictures were obtained of the patient and placed in the chart with the patient's or guardian's permission.

## 2021-07-30 DIAGNOSIS — G902 Horner's syndrome: Secondary | ICD-10-CM | POA: Diagnosis not present

## 2021-07-30 DIAGNOSIS — H02422 Myogenic ptosis of left eyelid: Secondary | ICD-10-CM | POA: Diagnosis not present

## 2021-08-06 ENCOUNTER — Ambulatory Visit: Payer: BC Managed Care – PPO | Admitting: Plastic Surgery

## 2021-09-02 DIAGNOSIS — M9902 Segmental and somatic dysfunction of thoracic region: Secondary | ICD-10-CM | POA: Diagnosis not present

## 2021-09-02 DIAGNOSIS — M9908 Segmental and somatic dysfunction of rib cage: Secondary | ICD-10-CM | POA: Diagnosis not present

## 2021-09-02 DIAGNOSIS — M6240 Contracture of muscle, unspecified site: Secondary | ICD-10-CM | POA: Diagnosis not present

## 2021-09-03 DIAGNOSIS — G902 Horner's syndrome: Secondary | ICD-10-CM | POA: Diagnosis not present

## 2021-09-03 DIAGNOSIS — H02422 Myogenic ptosis of left eyelid: Secondary | ICD-10-CM | POA: Diagnosis not present

## 2021-09-05 DIAGNOSIS — M6240 Contracture of muscle, unspecified site: Secondary | ICD-10-CM | POA: Diagnosis not present

## 2021-09-05 DIAGNOSIS — M5134 Other intervertebral disc degeneration, thoracic region: Secondary | ICD-10-CM | POA: Diagnosis not present

## 2021-09-05 DIAGNOSIS — M9908 Segmental and somatic dysfunction of rib cage: Secondary | ICD-10-CM | POA: Diagnosis not present

## 2021-09-05 DIAGNOSIS — M9902 Segmental and somatic dysfunction of thoracic region: Secondary | ICD-10-CM | POA: Diagnosis not present

## 2021-09-17 DIAGNOSIS — M79605 Pain in left leg: Secondary | ICD-10-CM | POA: Diagnosis not present

## 2021-09-17 DIAGNOSIS — M25551 Pain in right hip: Secondary | ICD-10-CM | POA: Diagnosis not present

## 2021-09-17 DIAGNOSIS — M79604 Pain in right leg: Secondary | ICD-10-CM | POA: Diagnosis not present

## 2021-09-20 DIAGNOSIS — M76891 Other specified enthesopathies of right lower limb, excluding foot: Secondary | ICD-10-CM | POA: Diagnosis not present

## 2021-09-20 DIAGNOSIS — M25551 Pain in right hip: Secondary | ICD-10-CM | POA: Diagnosis not present

## 2021-09-24 ENCOUNTER — Other Ambulatory Visit: Payer: Self-pay | Admitting: Physician Assistant

## 2021-09-24 DIAGNOSIS — R52 Pain, unspecified: Secondary | ICD-10-CM | POA: Diagnosis not present

## 2021-09-24 DIAGNOSIS — R1030 Lower abdominal pain, unspecified: Secondary | ICD-10-CM | POA: Diagnosis not present

## 2021-09-24 DIAGNOSIS — J029 Acute pharyngitis, unspecified: Secondary | ICD-10-CM | POA: Diagnosis not present

## 2021-09-24 DIAGNOSIS — R5383 Other fatigue: Secondary | ICD-10-CM | POA: Diagnosis not present

## 2021-09-24 DIAGNOSIS — Z20822 Contact with and (suspected) exposure to covid-19: Secondary | ICD-10-CM | POA: Diagnosis not present

## 2021-09-24 DIAGNOSIS — R198 Other specified symptoms and signs involving the digestive system and abdomen: Secondary | ICD-10-CM | POA: Diagnosis not present

## 2021-09-24 DIAGNOSIS — Z86010 Personal history of colonic polyps: Secondary | ICD-10-CM | POA: Diagnosis not present

## 2021-09-24 DIAGNOSIS — R102 Pelvic and perineal pain: Secondary | ICD-10-CM | POA: Diagnosis not present

## 2021-09-24 DIAGNOSIS — R432 Parageusia: Secondary | ICD-10-CM | POA: Diagnosis not present

## 2021-09-24 DIAGNOSIS — M791 Myalgia, unspecified site: Secondary | ICD-10-CM | POA: Diagnosis not present

## 2021-09-24 DIAGNOSIS — R5381 Other malaise: Secondary | ICD-10-CM | POA: Diagnosis not present

## 2021-10-01 ENCOUNTER — Other Ambulatory Visit: Payer: Self-pay

## 2021-10-01 ENCOUNTER — Ambulatory Visit
Admission: RE | Admit: 2021-10-01 | Discharge: 2021-10-01 | Disposition: A | Discharge status: Home / Self Care | Payer: BC Managed Care – PPO | Source: Ambulatory Visit | Attending: Physician Assistant | Admitting: Physician Assistant

## 2021-10-01 DIAGNOSIS — K5730 Diverticulosis of large intestine without perforation or abscess without bleeding: Secondary | ICD-10-CM | POA: Diagnosis not present

## 2021-10-01 DIAGNOSIS — R1030 Lower abdominal pain, unspecified: Secondary | ICD-10-CM

## 2021-10-01 DIAGNOSIS — K3189 Other diseases of stomach and duodenum: Secondary | ICD-10-CM | POA: Diagnosis not present

## 2021-10-01 DIAGNOSIS — R102 Pelvic and perineal pain: Secondary | ICD-10-CM

## 2021-10-01 DIAGNOSIS — Z90710 Acquired absence of both cervix and uterus: Secondary | ICD-10-CM | POA: Diagnosis not present

## 2021-10-01 DIAGNOSIS — K6389 Other specified diseases of intestine: Secondary | ICD-10-CM | POA: Diagnosis not present

## 2021-10-01 MED ORDER — IOPAMIDOL (ISOVUE-300) INJECTION 61%
100.0000 mL | Freq: Once | INTRAVENOUS | Status: AC | PRN
  Administered 2021-10-01: 100 mL via INTRAVENOUS

## 2021-10-03 ENCOUNTER — Other Ambulatory Visit: Payer: Self-pay | Admitting: Physician Assistant

## 2021-10-03 DIAGNOSIS — K5792 Diverticulitis of intestine, part unspecified, without perforation or abscess without bleeding: Secondary | ICD-10-CM

## 2021-10-18 ENCOUNTER — Other Ambulatory Visit: Payer: Self-pay | Admitting: Diagnostic Neuroimaging

## 2021-10-26 ENCOUNTER — Ambulatory Visit
Admission: RE | Admit: 2021-10-26 | Discharge: 2021-10-26 | Disposition: A | Discharge status: Home / Self Care | Payer: BC Managed Care – PPO | Source: Ambulatory Visit | Attending: Physician Assistant | Admitting: Physician Assistant

## 2021-10-26 ENCOUNTER — Other Ambulatory Visit: Payer: Self-pay

## 2021-10-26 DIAGNOSIS — N281 Cyst of kidney, acquired: Secondary | ICD-10-CM | POA: Diagnosis not present

## 2021-10-26 DIAGNOSIS — K828 Other specified diseases of gallbladder: Secondary | ICD-10-CM | POA: Diagnosis not present

## 2021-10-26 DIAGNOSIS — K5792 Diverticulitis of intestine, part unspecified, without perforation or abscess without bleeding: Secondary | ICD-10-CM

## 2021-10-26 MED ORDER — GADOBENATE DIMEGLUMINE 529 MG/ML IV SOLN
15.0000 mL | Freq: Once | INTRAVENOUS | Status: AC | PRN
  Administered 2021-10-26: 15 mL via INTRAVENOUS

## 2021-10-28 DIAGNOSIS — D485 Neoplasm of uncertain behavior of skin: Secondary | ICD-10-CM | POA: Diagnosis not present

## 2021-10-28 DIAGNOSIS — L918 Other hypertrophic disorders of the skin: Secondary | ICD-10-CM | POA: Diagnosis not present

## 2021-10-28 DIAGNOSIS — D225 Melanocytic nevi of trunk: Secondary | ICD-10-CM | POA: Diagnosis not present

## 2021-10-28 DIAGNOSIS — L720 Epidermal cyst: Secondary | ICD-10-CM | POA: Diagnosis not present

## 2021-11-12 DIAGNOSIS — Z800 Family history of malignant neoplasm of digestive organs: Secondary | ICD-10-CM | POA: Diagnosis not present

## 2021-11-12 DIAGNOSIS — R198 Other specified symptoms and signs involving the digestive system and abdomen: Secondary | ICD-10-CM | POA: Diagnosis not present

## 2021-11-12 DIAGNOSIS — Z86010 Personal history of colonic polyps: Secondary | ICD-10-CM | POA: Diagnosis not present

## 2021-11-12 DIAGNOSIS — K828 Other specified diseases of gallbladder: Secondary | ICD-10-CM | POA: Diagnosis not present

## 2021-11-14 DIAGNOSIS — Z6829 Body mass index (BMI) 29.0-29.9, adult: Secondary | ICD-10-CM | POA: Diagnosis not present

## 2021-11-14 DIAGNOSIS — Z01419 Encounter for gynecological examination (general) (routine) without abnormal findings: Secondary | ICD-10-CM | POA: Diagnosis not present

## 2021-11-20 DIAGNOSIS — D485 Neoplasm of uncertain behavior of skin: Secondary | ICD-10-CM | POA: Diagnosis not present

## 2021-11-20 DIAGNOSIS — L570 Actinic keratosis: Secondary | ICD-10-CM | POA: Diagnosis not present

## 2021-11-20 DIAGNOSIS — L918 Other hypertrophic disorders of the skin: Secondary | ICD-10-CM | POA: Diagnosis not present

## 2022-01-15 DIAGNOSIS — D127 Benign neoplasm of rectosigmoid junction: Secondary | ICD-10-CM | POA: Diagnosis not present

## 2022-01-15 DIAGNOSIS — K5730 Diverticulosis of large intestine without perforation or abscess without bleeding: Secondary | ICD-10-CM | POA: Diagnosis not present

## 2022-01-15 DIAGNOSIS — Z86010 Personal history of colonic polyps: Secondary | ICD-10-CM | POA: Diagnosis not present

## 2022-01-15 DIAGNOSIS — K644 Residual hemorrhoidal skin tags: Secondary | ICD-10-CM | POA: Diagnosis not present

## 2022-01-15 DIAGNOSIS — K648 Other hemorrhoids: Secondary | ICD-10-CM | POA: Diagnosis not present

## 2022-01-17 DIAGNOSIS — Z1231 Encounter for screening mammogram for malignant neoplasm of breast: Secondary | ICD-10-CM | POA: Diagnosis not present

## 2022-01-17 DIAGNOSIS — E349 Endocrine disorder, unspecified: Secondary | ICD-10-CM | POA: Diagnosis not present

## 2022-01-17 DIAGNOSIS — E079 Disorder of thyroid, unspecified: Secondary | ICD-10-CM | POA: Diagnosis not present

## 2022-01-17 DIAGNOSIS — E8881 Metabolic syndrome: Secondary | ICD-10-CM | POA: Diagnosis not present

## 2022-01-17 DIAGNOSIS — I10 Essential (primary) hypertension: Secondary | ICD-10-CM | POA: Diagnosis not present

## 2022-01-17 DIAGNOSIS — R799 Abnormal finding of blood chemistry, unspecified: Secondary | ICD-10-CM | POA: Diagnosis not present

## 2022-01-17 DIAGNOSIS — R5382 Chronic fatigue, unspecified: Secondary | ICD-10-CM | POA: Diagnosis not present

## 2022-03-19 DIAGNOSIS — L720 Epidermal cyst: Secondary | ICD-10-CM | POA: Diagnosis not present

## 2022-03-19 DIAGNOSIS — L820 Inflamed seborrheic keratosis: Secondary | ICD-10-CM | POA: Diagnosis not present

## 2022-03-19 DIAGNOSIS — D2339 Other benign neoplasm of skin of other parts of face: Secondary | ICD-10-CM | POA: Diagnosis not present

## 2022-03-19 DIAGNOSIS — L821 Other seborrheic keratosis: Secondary | ICD-10-CM | POA: Diagnosis not present

## 2022-04-01 DIAGNOSIS — H02422 Myogenic ptosis of left eyelid: Secondary | ICD-10-CM | POA: Diagnosis not present

## 2022-05-07 ENCOUNTER — Ambulatory Visit: Payer: BC Managed Care – PPO | Admitting: Diagnostic Neuroimaging

## 2022-09-08 DIAGNOSIS — Z0000 Encounter for general adult medical examination without abnormal findings: Secondary | ICD-10-CM | POA: Diagnosis not present

## 2022-09-10 DIAGNOSIS — E039 Hypothyroidism, unspecified: Secondary | ICD-10-CM | POA: Diagnosis not present

## 2022-09-15 DIAGNOSIS — R634 Abnormal weight loss: Secondary | ICD-10-CM | POA: Diagnosis not present

## 2022-09-15 DIAGNOSIS — E0590 Thyrotoxicosis, unspecified without thyrotoxic crisis or storm: Secondary | ICD-10-CM | POA: Diagnosis not present

## 2022-09-15 DIAGNOSIS — E782 Mixed hyperlipidemia: Secondary | ICD-10-CM | POA: Diagnosis not present

## 2022-09-15 DIAGNOSIS — Z0000 Encounter for general adult medical examination without abnormal findings: Secondary | ICD-10-CM | POA: Diagnosis not present

## 2022-10-07 DIAGNOSIS — M25551 Pain in right hip: Secondary | ICD-10-CM | POA: Diagnosis not present

## 2022-10-07 DIAGNOSIS — M545 Low back pain, unspecified: Secondary | ICD-10-CM | POA: Diagnosis not present

## 2022-10-21 ENCOUNTER — Encounter: Payer: Self-pay | Admitting: Diagnostic Neuroimaging

## 2022-10-21 ENCOUNTER — Ambulatory Visit (INDEPENDENT_AMBULATORY_CARE_PROVIDER_SITE_OTHER): Payer: BC Managed Care – PPO | Admitting: Diagnostic Neuroimaging

## 2022-10-21 VITALS — BP 156/86 | HR 78 | Ht 64.0 in | Wt 134.2 lb

## 2022-10-21 DIAGNOSIS — G43109 Migraine with aura, not intractable, without status migrainosus: Secondary | ICD-10-CM | POA: Diagnosis not present

## 2022-10-21 MED ORDER — NURTEC 75 MG PO TBDP: 75.0000 mg | ORAL_TABLET | Freq: Every day | ORAL | 6 refills | Status: DC | PRN

## 2022-10-21 MED ORDER — RIZATRIPTAN BENZOATE 10 MG PO TBDP: 10.0000 mg | ORAL_TABLET | ORAL | 11 refills | Status: DC | PRN

## 2022-10-21 NOTE — Progress Notes (Signed)
[facility]  PATIENT: Megan Castillo DOB: 09/27/1966  REFERRING CLINICIAN: [name], MD  HISTORY FROM: patient  REASON FOR VISIT: follow up    HISTORICAL  CHIEF COMPLAINT:  Chief Complaint  Patient presents with   Follow-up    Pt reports feeling good. She states she still has a migraines once or twice a month. Sumatriptan it helps for relief. Room 7 alone    HISTORY OF PRESENT ILLNESS:   UPDATE (10/21/22, VRP): Since last visit, doing fair; 2 HA per month; sumatriptan helps, but now takes up 3-5 hours to help. Usually goes to sleep.   UPDATE (05/07/21, VRP): Since last visit, doing well. Symptoms are stable. HA 2-3 per month. Tolerating sumatriptan '50mg'$  as needed (breaks '100mg'$  capsule in half). Left ptosis stable. Saw ophthalmology and no specific cause found.  UPDATE (10/10/20, VRP): Since last visit, doing well. Symptoms are stable. 2 HA per month. Left ptosis (noted in 2018 ID photo) slightly more noticeable. No alleviating or aggravating factors.  UPDATE (10/10/19, VRP): Since last visit, now with HA every 4-6 weeks. Sumatriptan helps, but takes a few hours to help. No alleviating or aggravating factors. Tolerating sumatriptan.    UPDATE (10/04/18, VRP): Since last visit, doing well. Symptoms are mild. Severity is mild. No alleviating or aggravating factors. Tolerating sumatriptan 50.  Avg 1 HA every 6 months.  UPDATE (12/02/17, VRP): Since last visit, doing well for several months; 1 HA every 2-3 months. Then in Oct 2018 had 1 HA per week. Tolerating sumatriptan helps. No new alleviating or aggravating factors.   UPDATE 08/05/16: Since last visit, doing well with headaches (1 every month or other month). Sumatriptan '50mg'$  daily well. Still with high levels of stress (husband just d/c'd from hospital today for MI).   PRIOR HPI (03/03/16): 56 year old female here for evaluation of headaches. For past 3-4 years patient has had onset of unilateral,  sometimes bilateral, periorbital severe intense headache and pain. Patient typically lays down, turns down the lights, uses warm washcloth to help relieve symptoms. Patient feels tired and foggy after headaches. Sometimes she has decreased peripheral vision and scotoma during the headaches. Patient has some sensitivity to light. No nausea or vomiting. She has some dizziness. She has some sinus pressure. Patient thought she had sinus headaches for many years and went to ENT recently for evaluation. Patient was told she may have migraine headaches and referred to me for further evaluation. Patient having approximately 2-4 days of migraine headache per month. Triggering factors include change of weather, change of season, decreased sleep. Patient tried sumatriptan in the past with mild relief.   REVIEW OF SYSTEMS: Full 14 system review of systems performed and negative with exception of: as per HPI.    ALLERGIES: Allergies  Allergen Reactions   Bee Pollen Rash    HOME MEDICATIONS: Outpatient Medications Prior to Visit  Medication Sig Dispense Refill   NP THYROID 30 MG tablet Take 30 mg by mouth daily.     rosuvastatin (CRESTOR) 10 MG tablet Take 10 mg by mouth daily.     SUMAtriptan (IMITREX) 100 MG tablet TAKE 1 TABLET BY MOUTH IF NEEDED FOR MIGRAINES MAY REPEAT 1 AFTER 2 HOURS 2 PER DAY OR 8 PER MONTH 8 tablet 12   No facility-administered medications prior to visit.    PAST MEDICAL HISTORY: Past Medical History:  Diagnosis Date   Arthritis    Constipation    Hemorrhoids    Migraine    Rectal pain  PAST SURGICAL HISTORY: Past Surgical History:  Procedure Laterality Date   ABDOMINAL HYSTERECTOMY  11/2009   BREAST REDUCTION SURGERY Bilateral 04/25/2021   Procedure: BREAST REDUCTION WITH LIPOSUCTION;  Surgeon: Wallace Going, DO;  Location: [facility];  Service: Plastics;  Laterality: Bilateral;  3 hours   HEMORRHOID SURGERY     NASAL RECONSTRUCTION       FAMILY HISTORY: Family History  Problem Relation Age of Onset   Heart disease Mother    Cancer Brother 40       Burkett lymphoma    SOCIAL HISTORY:  Social History   Socioeconomic History   Marital status: Married    Spouse name: Doctor, general practice   Number of children: 2   Years of education: 13 1/2   Highest education level: Not on file  Occupational History    Comment: Journalist, newspaper  Tobacco Use   Smoking status: Never   Smokeless tobacco: Never  Substance and Sexual Activity   Alcohol use: No   Drug use: No   Sexual activity: Yes  Other Topics Concern   Not on file  Social History Narrative   ** Merged History Encounter **       Lives at home with husband Caffeine use-tea twice a day   Social Determinants of Health   Financial Resource Strain: Not on file  Food Insecurity: Not on file  Transportation Needs: Not on file  Physical Activity: Not on file  Stress: Not on file  Social Connections: Not on file  Intimate Partner Violence: Not on file     PHYSICAL EXAM  GENERAL EXAM/CONSTITUTIONAL: Vitals:  Vitals:   10/21/22 1605  BP: (!) 156/86  Pulse: 78  Weight: 134 lb 4 oz (60.9 kg)  Height: '5\' 4"'$  (1.626 m)   Body mass index is 23.04 kg/m. No results found. Patient is in no distress; well developed, nourished and groomed; neck is supple  CARDIOVASCULAR: Examination of carotid arteries is normal; no carotid bruits Regular rate and rhythm, no murmurs Examination of peripheral vascular system by observation and palpation is normal  EYES: Ophthalmoscopic exam of optic discs and posterior segments is normal; no papilledema or hemorrhages  MUSCULOSKELETAL: Gait, strength, tone, movements noted in Neurologic exam below  NEUROLOGIC: MENTAL STATUS:      No data to display         awake, alert, oriented to person, place and time recent and remote memory intact normal attention and concentration language fluent, comprehension intact, naming  intact,  fund of knowledge appropriate  CRANIAL NERVE:  2nd - no papilledema on fundoscopic exam 2nd, 3rd, 4th, 6th - pupils reactive to light; visual fields full to confrontation, extraocular muscles intact, no nystagmus 5th - facial sensation symmetric 7th - facial strength symmetric 8th - hearing intact 9th - palate elevates symmetrically, uvula midline 11th - shoulder shrug symmetric 12th - tongue protrusion midline  MOTOR:  normal bulk and tone, full strength in the BUE, BLE  SENSORY:  normal and symmetric to light touch  COORDINATION:  finger-nose-finger, fine finger movements normal  REFLEXES:  deep tendon reflexes present and symmetric  GAIT/STATION:  narrow based gait    DIAGNOSTIC DATA (LABS, IMAGING, TESTING) - I reviewed patient records, labs, notes, testing and imaging myself where available.  Lab Results  Component Value Date   WBC 8.3 11/01/2018   HGB 14.0 11/01/2018   HCT 44.7 11/01/2018   MCV 92.2 11/01/2018   PLT 214 11/01/2018      Component Value  Date/Time   NA 141 11/01/2018 1858   K 3.5 11/01/2018 1858   CL 107 11/01/2018 1858   CO2 21 (L) 11/01/2018 1858   GLUCOSE 93 11/01/2018 1858   BUN 10 11/01/2018 1858   CREATININE 0.83 11/01/2018 1858   CREATININE 0.70 04/10/2013 1702   CALCIUM 9.2 11/01/2018 1858   PROT 7.4 04/10/2013 1702   ALBUMIN 4.5 04/10/2013 1702   AST 28 04/10/2013 1702   ALT 28 04/10/2013 1702   ALKPHOS 80 04/10/2013 1702   BILITOT 0.3 04/10/2013 1702   GFRNONAA >60 11/01/2018 1858   GFRAA >60 11/01/2018 1858   No results found for: "CHOL", "HDL", "LDLCALC", "LDLDIRECT", "TRIG", "CHOLHDL" No results found for: "HGBA1C" No results found for: "VITAMINB12" Lab Results  Component Value Date   TSH 3.320 10/10/2020     02/09/13 MRI brain (with and without contrast) 1. Few punctate subcortical, periventricular and juxtacortical foci of gliosis. These findings are non-specific and considerations include autoimmune,  inflammatory, post-infectious, microvascular ischemic or migraine associated etiologies.  2. No abnormal enhancing lesions.  04/16/21 MRI brain, MRA head / neck - unremarkable  10/10/20  Ref Range & Units 6 mo ago   AChR Binding Ab, Serum 0.00 - 0.24 nmol/L <0.03   Comment:                                Negative:   0.00 - 0.24                                 Borderline: 0.25 - 0.40                                 Positive:         >0.40    10/10/20   Ref Range & Units 6 mo ago  MuSK Antibodies U/mL <1.0   Comment:   Reference Range:    Negative: <1.0    Positive: 1.0 or higher       ASSESSMENT AND PLAN  56 y.o. year old female here with headaches / migraine features. Symptoms started around 2013.  Meds tried: sumatriptan   Dx:  1. 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 May repeat in 2 hours if needed    Dispense:  9 tablet    Refill:  11   Rimegepant Sulfate (NURTEC) 75 MG TBDP    Sig: Take 75 mg by mouth daily as needed.    Dispense:  8 tablet    Refill:  6   Return in about 6 months (around 04/21/2023) for with NP Butler Denmark).    Penni Bombard, MD 34/91/7915, 0:56 PM Certified in Neurology, Neurophysiology  and Neuroimaging  [facility] [address] [place], [place] [address] [phone]

## 2022-10-22 ENCOUNTER — Telehealth: Payer: Self-pay | Admitting: Neurology

## 2022-10-22 NOTE — Telephone Encounter (Addendum)
Approval received via fax, 10/22/22-10/22/23.

## 2022-10-22 NOTE — Telephone Encounter (Signed)
PA c/o on CMM/[employer] KEY:  [id number] Will await determination

## 2022-11-01 ENCOUNTER — Emergency Department (HOSPITAL_COMMUNITY): Payer: BC Managed Care – PPO

## 2022-11-01 ENCOUNTER — Emergency Department (HOSPITAL_COMMUNITY)
Admission: EM | Admit: 2022-11-01 | Discharge: 2022-11-02 | Disposition: A | Discharge status: Home / Self Care | Payer: BC Managed Care – PPO | Attending: Emergency Medicine | Admitting: Emergency Medicine

## 2022-11-01 ENCOUNTER — Other Ambulatory Visit: Payer: Self-pay

## 2022-11-01 DIAGNOSIS — R519 Headache, unspecified: Secondary | ICD-10-CM | POA: Diagnosis not present

## 2022-11-01 DIAGNOSIS — I10 Essential (primary) hypertension: Secondary | ICD-10-CM | POA: Insufficient documentation

## 2022-11-01 DIAGNOSIS — R079 Chest pain, unspecified: Secondary | ICD-10-CM | POA: Diagnosis not present

## 2022-11-01 DIAGNOSIS — I160 Hypertensive urgency: Secondary | ICD-10-CM | POA: Insufficient documentation

## 2022-11-01 LAB — BASIC METABOLIC PANEL
Anion gap: 11 (ref 5–15)
BUN: 9 mg/dL (ref 6–20)
CO2: 25 mmol/L (ref 22–32)
Calcium: 9 mg/dL (ref 8.9–10.3)
Chloride: 105 mmol/L (ref 98–111)
Creatinine, Ser: 0.61 mg/dL (ref 0.44–1.00)
GFR, Estimated: >60 mL/min (ref >60)
Glucose, Bld: 103 mg/dL — ABNORMAL HIGH (ref 70–99)
Potassium: 3.2 mmol/L — ABNORMAL LOW (ref 3.5–5.1)
Sodium: 141 mmol/L (ref 135–145)

## 2022-11-01 LAB — TROPONIN I (HIGH SENSITIVITY)
Troponin I (High Sensitivity): 11 ng/L (ref <18)
Troponin I (High Sensitivity): 14 ng/L (ref <18)

## 2022-11-01 LAB — CBC
HCT: 44.5 % (ref 36.0–46.0)
Hemoglobin: 14.4 g/dL (ref 12.0–15.0)
MCH: 29.9 pg (ref 26.0–34.0)
MCHC: 32.4 g/dL (ref 30.0–36.0)
MCV: 92.5 fL (ref 80.0–100.0)
Platelets: 238 10*3/uL (ref 150–400)
RBC: 4.81 MIL/uL (ref 3.87–5.11)
RDW: 14 % (ref 11.5–15.5)
WBC: 8 10*3/uL (ref 4.0–10.5)
nRBC: 0 % (ref 0.0–0.2)

## 2022-11-01 NOTE — ED Triage Notes (Signed)
Pt presents with complaint of headache that began earlier. She states she took her BP and it was high.  Feels a tightness in her chest as well, non radiating.  Had a dose of amlodopine she had been given previously by a provider and took that about 1.5 hours ago.  Pt has had no n/v/d or shob.

## 2022-11-01 NOTE — ED Provider Triage Note (Signed)
Emergency Medicine Provider Triage Evaluation Note  Megan Castillo , a 56 y.o. female  was evaluated in triage.  Pt complains of headache, chest tightness, generalized weakness, and elevated BP. Pt states having been on HCTZ previously, but was taken off because it dropped her BP too low. Was given some 2.5 mg amlodipine last year, but doesn't take it daily because her doctor said she didn't need it. Today started not feeling well, checked her BP and it was 167/116. She kept checking her BP and it kept getting higher. She tried taking amlodipine without change. Now her chest feels tight and she has a headache on the top of her head.   Review of Systems  Positive: Headache, chest tightness Negative: Blurry vision, syncope, SOB  Physical Exam  BP (!) 196/109 (BP Location: Right Arm)   Pulse (!) 131   Temp 98.5 F (36.9 C)   Resp 16   SpO2 99%  Gen:   Awake, no distress   Resp:  Normal effort  MSK:   Moves extremities without difficulty  Other:    Medical Decision Making  Medically screening exam initiated at 7:45 PM.  Appropriate orders placed.  LUXE CUADROS was informed that the remainder of the evaluation will be completed by another provider, this initial triage assessment does not replace that evaluation, and the importance of remaining in the ED until their evaluation is complete.  Workup initiated   Estill Cotta 11/01/22 1949

## 2022-11-02 ENCOUNTER — Emergency Department (HOSPITAL_COMMUNITY): Payer: BC Managed Care – PPO

## 2022-11-02 DIAGNOSIS — R079 Chest pain, unspecified: Secondary | ICD-10-CM | POA: Diagnosis not present

## 2022-11-02 LAB — D-DIMER, QUANTITATIVE: D-Dimer, Quant: 0.5 ug/mL-FEU (ref 0.00–0.50)

## 2022-11-02 LAB — HEPATIC FUNCTION PANEL
ALT: 16 U/L (ref 0–44)
AST: 20 U/L (ref 15–41)
Albumin: 4.2 g/dL (ref 3.5–5.0)
Alkaline Phosphatase: 91 U/L (ref 38–126)
Bilirubin, Direct: <0.1 mg/dL (ref 0.0–0.2)
Total Bilirubin: 0.5 mg/dL (ref 0.3–1.2)
Total Protein: 7.1 g/dL (ref 6.5–8.1)

## 2022-11-02 LAB — LIPASE, BLOOD: Lipase: 42 U/L (ref 11–51)

## 2022-11-02 MED ORDER — LABETALOL HCL 5 MG/ML IV SOLN: 5.0000 mg | Freq: Once | INTRAVENOUS | Status: DC

## 2022-11-02 MED ORDER — DIPHENHYDRAMINE HCL 50 MG/ML IJ SOLN
12.5000 mg | Freq: Once | INTRAMUSCULAR | Status: AC
  Administered 2022-11-02: 12.5 mg via INTRAVENOUS
  Filled 2022-11-02: qty 1

## 2022-11-02 MED ORDER — ONDANSETRON HCL 4 MG/2ML IJ SOLN
4.0000 mg | Freq: Once | INTRAMUSCULAR | Status: AC
  Administered 2022-11-02: 4 mg via INTRAVENOUS
  Filled 2022-11-02: qty 2

## 2022-11-02 MED ORDER — KETOROLAC TROMETHAMINE 15 MG/ML IJ SOLN
15.0000 mg | Freq: Once | INTRAMUSCULAR | Status: AC
  Administered 2022-11-02: 15 mg via INTRAVENOUS
  Filled 2022-11-02: qty 1

## 2022-11-02 NOTE — ED Provider Notes (Signed)
[facility] EMERGENCY DEPARTMENT Provider Note   CSN: [id number] Arrival date & time: 11/01/22  1901     History  Chief Complaint  Patient presents with   Hypertension   Chest Pain    Megan Castillo is a 56 y.o. female. With past medical history of hypertension, GERD, migraines who presents to the emergency department with chest pain.  States yesterday she noticed she was generally feeling unwell. Was out grocery shopping and states she walked across the parking lot and felt ill. States she got home and took her blood pressure and it was 189/102. She laid down and got up 1 hr later to recheck and had increased to 190/116. She took a dose of amlodipine 2.'5mg'$  but states this did not touch her blood pressure. She is having sharp, intermittent, left sided chest pain that radiates to the left shoulder. She is also feeling short of breath on exertion. States walking back to room from the waiting room she felt "winded." She is having intermittent palpitations, bitemporal headache that is squeezing, and intermittent sharp abdominal pain. She denies any recent illnesses, cough or fever, numbness or tingling to the face or extremities, weakness, slurred speech, confusion, diplopia, dysuria, nausea, vomiting or diarrhea. More remotely, was on HCTZ at one point which was discontinued after she was hypotensive (although this was around the time she had a GIB). She was re-initated on Amlodipine 2.'5mg'$  but has not been taking this because her blood pressure has been normal around 110s/70s.   Hypertension Associated symptoms include chest pain, abdominal pain, headaches and shortness of breath.  Chest Pain Associated symptoms: abdominal pain, headache, palpitations and shortness of breath   Associated symptoms: no cough, no fever, no nausea and no vomiting        Home Medications Prior to Admission medications   Medication Sig Start Date End Date Taking? Authorizing Provider   cholecalciferol (VITAMIN D3) 25 MCG (1000 UNIT) tablet Take 1,000 Units by mouth daily.   Yes [provider]  cyanocobalamin (VITAMIN B12) 1000 MCG tablet Take 1,000 mcg by mouth daily.   Yes [provider]  NP THYROID 30 MG tablet Take 30 mg by mouth daily.   Yes [provider]  Probiotic Product (ALIGN PO) Take 1 capsule by mouth daily.   Yes [provider]  rizatriptan (MAXALT-MLT) 10 MG disintegrating tablet Take 1 tablet (10 mg total) by mouth as needed for migraine. May repeat in 2 hours if needed 10/21/22  Yes Penumalli, Earlean Polka, MD  rosuvastatin (CRESTOR) 10 MG tablet Take 10 mg by mouth daily.   Yes [provider]  Semaglutide, 1 MG/DOSE, (OZEMPIC, 1 MG/DOSE,) 4 MG/3ML SOPN Inject 1 mg into the skin once a week. Saturday   Yes [provider]  SUMAtriptan (IMITREX) 100 MG tablet TAKE 1 TABLET BY MOUTH IF NEEDED FOR MIGRAINES MAY REPEAT 1 AFTER 2 HOURS 2 PER DAY OR 8 PER MONTH Patient taking differently: Take 100 mg by mouth every 2 (two) hours as needed for migraine. MAY REPEAT 1 AFTER 2 HOURS; 2 PER DAY OR 8 PER MONTH 10/21/21  Yes Penumalli, Vikram R, MD  tretinoin (RETIN-A) 0.025 % cream Apply 1 Application topically at bedtime as needed (dry skin).   Yes [provider]  Rimegepant Sulfate (NURTEC) 75 MG TBDP Take 75 mg by mouth daily as needed. Patient taking differently: Take 75 mg by mouth daily as needed (migraine). 10/21/22   Penumalli, Earlean Polka, MD  Allergies    Bee pollen    Review of Systems   Review of Systems  Constitutional:  Negative for fever.  Respiratory:  Positive for shortness of breath. Negative for cough.   Cardiovascular:  Positive for chest pain and palpitations. Negative for leg swelling.  Gastrointestinal:  Positive for abdominal pain. Negative for diarrhea, nausea and vomiting.  Genitourinary:  Negative for dysuria.  Neurological:  Positive for headaches. Negative for syncope and  speech difficulty.  All other systems reviewed and are negative.   Physical Exam Updated Vital Signs BP 112/80   Pulse 66   Temp 99.1 F (37.3 C)   Resp 16   SpO2 98%  Physical Exam Vitals and nursing note reviewed.  Constitutional:      General: She is not in acute distress.    Appearance: Normal appearance. She is well-developed and normal weight. She is not ill-appearing or toxic-appearing.  HENT:     Head: Normocephalic and atraumatic.  Eyes:     General: No scleral icterus.    Extraocular Movements: Extraocular movements intact.     Pupils: Pupils are equal, round, and reactive to light.  Cardiovascular:     Rate and Rhythm: Regular rhythm. Tachycardia present.     Pulses:          Radial pulses are 2+ on the right side and 2+ on the left side.     Heart sounds: Normal heart sounds. No murmur heard. Pulmonary:     Effort: Pulmonary effort is normal. No tachypnea or respiratory distress.     Breath sounds: Normal breath sounds.  Chest:     Chest wall: No tenderness.  Abdominal:     General: Bowel sounds are normal.     Palpations: Abdomen is soft.  Musculoskeletal:        General: Normal range of motion.     Cervical back: Normal range of motion.     Right lower leg: No edema.     Left lower leg: No edema.  Skin:    General: Skin is warm and dry.     Capillary Refill: Capillary refill takes less than 2 seconds.  Neurological:     General: No focal deficit present.     Mental Status: She is alert and oriented to person, place, and time.  Psychiatric:        Mood and Affect: Mood normal.        Behavior: Behavior normal.     ED Results / Procedures / Treatments   Labs (all labs ordered are listed, but only abnormal results are displayed) Labs Reviewed  BASIC METABOLIC PANEL - Abnormal; Notable for the following components:      Result Value   Potassium 3.2 (*)    Glucose, Bld 103 (*)    All other components within normal limits  CBC  D-DIMER,  QUANTITATIVE  HEPATIC FUNCTION PANEL  LIPASE, BLOOD  TROPONIN I (HIGH SENSITIVITY)  TROPONIN I (HIGH SENSITIVITY)    EKG EKG Interpretation  Date/Time:  Saturday November 01 2022 19:23:54 EST Ventricular Rate:  105 PR Interval:  182 QRS Duration: 72 QT Interval:  340 QTC Calculation: 449 R Axis:   31 Text Interpretation: Sinus tachycardia Possible Left atrial enlargement Septal infarct , age undetermined Abnormal ECG No previous ECGs available Interpretation limited secondary to artifact Confirmed by Ripley Fraise [phone]) on 11/02/2022 6:53:28 AM  Radiology No results found.  Procedures Procedures    Medications Ordered in ED Medications  ketorolac (TORADOL) 15 MG/ML  injection 15 mg (15 mg Intravenous Given 11/02/22 0845)  ondansetron (ZOFRAN) injection 4 mg (4 mg Intravenous Given 11/02/22 0843)  diphenhydrAMINE (BENADRYL) injection 12.5 mg (12.5 mg Intravenous Given 11/02/22 6378)    ED Course/ Medical Decision Making/ A&P                           Medical Decision Making Amount and/or Complexity of Data Reviewed Labs: ordered. Radiology: ordered.  Risk Prescription drug management.  Initial Impression and Ddx 56 year old female who presents to the emergency department with symptomatic hypertension. She is overall well-appearing and non-septic or toxic in appearance. No focal neurological deficits. PE is unremarkable. Will obtain labs including troponin, CXR. She also will need abdominal labs as she was complaining of intermittent sharp abdominal pain. Will give headache cocktail and labetalol and reassess. Patient PMH that increases complexity of ED encounter:  hypertension, migraines Ddx: Acute chest syndrome, stable angina, atypical angina, CHF, migraine, hypertensive urgency or emergency, PRES  Interpretation of Diagnostics I independent reviewed and interpreted the labs as followed: bmp with mildly decreased k+, cbc wnl, d-dimer negative, troponin x2  negative  - I independently visualized the following imaging with scope of interpretation limited to determining acute life threatening conditions related to emergency care: CXR, which revealed ?hyperinflation of the RUL, nonspecific. CT head negative.   Patient Reassessment and Ultimate Disposition/Management Given headache cocktail with improvement in symptoms and blood pressure. Held labetalol for now.  Her labs are unremarkable. There is no evidence of end organ dysfunction or confusion so doubt PRES or hypertensive emergency. No evidence of ACS. No evidence of CHF on PE or CXR. Did not order BNP. Considered and doubt PE. Cannot PERC out for age, but Rock Nephew low risk. Symptoms inconsistent with dissection, tamponade.  She is prescribed amlodipine 2.'5mg'$  by PCP. Can continue this medication. Her work up is most consistent with hypertensive urgency. I have instructed her to take daily BP while consistently taking her medication and following up with PCP so they can make appropriate judgement on efficacy of medication. She verbalizes understanding. Feel she is appropriate for dc at this time.   Patient management required discussion with the following services or consulting groups:  None  Complexity of Problems Addressed Acute complicated illness or Injury  Additional Data Reviewed and Analyzed Further history obtained from: Past medical history and medications listed in the EMR, Recent PCP notes, Care Everywhere, and Prior labs/imaging results  Patient Encounter Risk Assessment Prescriptions and Consideration of hospitalization  Final Clinical Impression(s) / ED Diagnoses Final diagnoses:  Hypertensive urgency    Rx / DC Orders ED Discharge Orders     None         Mickie Hillier, PA-C 11/04/22 1453    Elgie Congo, MD 11/04/22 2256

## 2022-11-02 NOTE — ED Notes (Signed)
Pt states CP has increased severely and HA is pounding 6/10 as well as visual changes. Pt also states she is nauseous.

## 2022-11-02 NOTE — Discharge Instructions (Addendum)
You were seen in the emergency department today for elevated blood pressure. Please resume your amlodipine 2.'5mg'$  daily and record your blood pressure daily. Please follow-up with your primary care in the next two weeks to evaluate how this medication is controlling your pressure. I have attached a blood pressure record sheet for you. Please return for significantly worsening blood pressure with headache or visual changes, chest pain or shortness of breath.

## 2022-11-02 NOTE — ED Notes (Signed)
Patient transported to X-ray 

## 2022-11-02 NOTE — ED Notes (Signed)
Pt verbalizes understanding of discharge instructions. Opportunity for questions and answers were provided. Pt discharged from the ED.   ?

## 2022-11-07 DIAGNOSIS — R079 Chest pain, unspecified: Secondary | ICD-10-CM | POA: Diagnosis not present

## 2022-11-07 DIAGNOSIS — E0590 Thyrotoxicosis, unspecified without thyrotoxic crisis or storm: Secondary | ICD-10-CM | POA: Diagnosis not present

## 2022-11-07 DIAGNOSIS — R002 Palpitations: Secondary | ICD-10-CM | POA: Diagnosis not present

## 2022-11-07 DIAGNOSIS — I10 Essential (primary) hypertension: Secondary | ICD-10-CM | POA: Diagnosis not present

## 2022-11-11 DIAGNOSIS — R1013 Epigastric pain: Secondary | ICD-10-CM | POA: Diagnosis not present

## 2022-11-11 DIAGNOSIS — R6881 Early satiety: Secondary | ICD-10-CM | POA: Diagnosis not present

## 2022-11-11 DIAGNOSIS — R110 Nausea: Secondary | ICD-10-CM | POA: Diagnosis not present

## 2022-11-11 DIAGNOSIS — Z8719 Personal history of other diseases of the digestive system: Secondary | ICD-10-CM | POA: Diagnosis not present

## 2022-11-13 DIAGNOSIS — I10 Essential (primary) hypertension: Secondary | ICD-10-CM | POA: Diagnosis not present

## 2022-11-13 DIAGNOSIS — R0789 Other chest pain: Secondary | ICD-10-CM | POA: Diagnosis not present

## 2022-11-13 DIAGNOSIS — E039 Hypothyroidism, unspecified: Secondary | ICD-10-CM | POA: Diagnosis not present

## 2022-11-14 DIAGNOSIS — E039 Hypothyroidism, unspecified: Secondary | ICD-10-CM | POA: Diagnosis not present

## 2022-11-14 DIAGNOSIS — I10 Essential (primary) hypertension: Secondary | ICD-10-CM | POA: Diagnosis not present

## 2022-11-19 ENCOUNTER — Ambulatory Visit: Payer: Self-pay | Admitting: Internal Medicine

## 2022-11-25 ENCOUNTER — Ambulatory Visit: Payer: BC Managed Care – PPO | Attending: Cardiovascular Disease | Admitting: Cardiovascular Disease

## 2022-11-25 ENCOUNTER — Encounter: Payer: Self-pay | Admitting: Cardiovascular Disease

## 2022-11-25 VITALS — BP 141/83 | HR 76 | Ht 63.0 in | Wt 132.6 lb

## 2022-11-25 DIAGNOSIS — I129 Hypertensive chronic kidney disease with stage 1 through stage 4 chronic kidney disease, or unspecified chronic kidney disease: Secondary | ICD-10-CM | POA: Diagnosis not present

## 2022-11-25 DIAGNOSIS — R079 Chest pain, unspecified: Secondary | ICD-10-CM | POA: Diagnosis not present

## 2022-11-25 DIAGNOSIS — E7800 Pure hypercholesterolemia, unspecified: Secondary | ICD-10-CM | POA: Diagnosis not present

## 2022-11-25 DIAGNOSIS — D3500 Benign neoplasm of unspecified adrenal gland: Secondary | ICD-10-CM

## 2022-11-25 MED ORDER — METOPROLOL TARTRATE 100 MG PO TABS: ORAL_TABLET | ORAL | 0 refills | Status: DC

## 2022-11-25 NOTE — Progress Notes (Signed)
Cardiology Office Note:    Date:  12/02/2022   ID:  Megan Castillo, DOB [birth year], MRN [id number]  PCP:  [name], [place] Providers Cardiologist:  None     Referring MD: [name], MD   Chief Complaint  Patient presents with   Consult  Megan Castillo is a 56 y.o. female who is being seen today for the evaluation of elevated blood pressure at the request of [name], MD.   History of Present Illness:    Megan Castillo is a 56 y.o. female with a hx of migraine headaches and recent episodes of severely elevated blood pressure.  She was seen in the emergency room on 11/01/2022.  While grocery shopping she felt ill and when she arrived home her blood pressure was about 190/110.  Amlodipine 2.5 mg taken x 1 did not help.  She developed sharp left-sided chest pain radiating to the shoulder and felt short of breath with minimal activity.  She went to the emergency room and the workup was benign, but her potassium was low at 3.2.  Chest x-ray was normal, D-dimer was negative, cardiac enzymes were normal twice.  She describes the fact that her blood pressure spikes up and will stay elevated for several hours.  This has only been an issue in the last roughly 3 years but occurred about 15 times in the last year.  Between these episodes of spiking blood pressure her usual blood pressure recordings at home are actually low with systolics in the 37-169 range.  She feels poorly when her blood pressure is high.  She has not had syncope or dizziness but she does have headaches.  She has been prescribed amlodipine 2.5 mg daily.  The episodes of elevated blood pressure sometimes associated chest pressure.  She is concerned about the family history of heart disease.  Her grandmother lived to age 78 and had angina.  Her mother had a myocardial infarction in her 61s and then developed congestive heart failure.  She has a brother who had a myocardial infarction  at age 70.  She does not have shortness of breath at rest or with activity.  Denies orthopnea, PND, lower extremity edema or focal neurological complaints other than migraine headaches.  Does not have palpitations and has not experienced syncope.  Labs performed on November 01, 2022 showed potassium 3.2, but she is not taking diuretics and has not had problems with nausea, vomiting, diarrhea.  Her creatinine is normal at 0.61.  Her hemoglobin is normal at 14.4.  TSH performed on October 2 was suppressed at 0.03 (she is on NP thyroid has recently been adjusted.)  She has been taking Ozempic and her BMI is down to 23.  She does not smoke.  Although her standard lipid profile is not bad, with total cholesterol 173 and LDL 104, she has a high LDL particle number at 1796 and a high particle number of small LDL at 790.  She is taking rosuvastatin 10 mg daily, started after these labs drawn.  She has normal renal function.  Past Medical History:  Diagnosis Date   Arthritis    Constipation    Hemorrhoids    Migraine    Rectal pain     Past Surgical History:  Procedure Laterality Date   ABDOMINAL HYSTERECTOMY  11/2009   BREAST REDUCTION SURGERY Bilateral 04/25/2021   Procedure: BREAST REDUCTION WITH LIPOSUCTION;  Surgeon: Wallace Going, DO;  Location: [employer];  Service: Clinical cytogeneticist;  Laterality: Bilateral;  3 hours   HEMORRHOID SURGERY     NASAL RECONSTRUCTION      Current Medications: Current Meds  Medication Sig   amLODIPine Besylate-Celecoxib 2.5-200 MG TABS Take 1 tablet by mouth daily in the afternoon.   cholecalciferol (VITAMIN D3) 25 MCG (1000 UNIT) tablet Take 1,000 Units by mouth daily.   cyanocobalamin (VITAMIN B12) 1000 MCG tablet Take 1,000 mcg by mouth daily.   metoprolol tartrate (LOPRESSOR) 100 MG tablet Take 1 tablet by mouth once for procedure.   NP THYROID 30 MG tablet Take 30 mg by mouth daily.   Probiotic Product (ALIGN PO) Take 1 capsule by mouth  daily.   Rimegepant Sulfate (NURTEC) 75 MG TBDP Take 75 mg by mouth daily as needed. (Patient taking differently: Take 75 mg by mouth daily as needed (migraine).)   rizatriptan (MAXALT-MLT) 10 MG disintegrating tablet Take 1 tablet (10 mg total) by mouth as needed for migraine. May repeat in 2 hours if needed   rosuvastatin (CRESTOR) 10 MG tablet Take 10 mg by mouth daily.   Semaglutide, 1 MG/DOSE, (OZEMPIC, 1 MG/DOSE,) 4 MG/3ML SOPN Inject 1 mg into the skin once a week. Saturday   SUMAtriptan (IMITREX) 100 MG tablet TAKE 1 TABLET BY MOUTH IF NEEDED FOR MIGRAINES MAY REPEAT 1 AFTER 2 HOURS 2 PER DAY OR 8 PER MONTH (Patient taking differently: Take 100 mg by mouth every 2 (two) hours as needed for migraine. MAY REPEAT 1 AFTER 2 HOURS; 2 PER DAY OR 8 PER MONTH)   tretinoin (RETIN-A) 0.025 % cream Apply 1 Application topically at bedtime as needed (dry skin).     Allergies:   Bee pollen   Social History   Socioeconomic History   Marital status: Married    Spouse name: Elta Guadeloupe   Number of children: 2   Years of education: 13 1/2   Highest education level: Not on file  Occupational History    Comment: Journalist, newspaper  Tobacco Use   Smoking status: Never   Smokeless tobacco: Never  Substance and Sexual Activity   Alcohol use: No   Drug use: No   Sexual activity: Yes  Other Topics Concern   Not on file  Social History Narrative   ** Merged History Encounter **       Lives at home with husband Caffeine use-tea twice a day   Social Determinants of Health   Financial Resource Strain: Not on file  Food Insecurity: Not on file  Transportation Needs: Not on file  Physical Activity: Not on file  Stress: Not on file  Social Connections: Not on file     Family History: The patient's family history includes Cancer (age of onset: 85) in her brother; Heart disease in her mother.  ROS:   Please see the history of present illness.     All other systems reviewed and are  negative.  EKGs/Labs/Other Studies Reviewed:    The following studies were reviewed today: CT of the abdomen-pelvis 10/01/2021 and MRI of the abdomen 10/26/2021 does not show aortic atherosclerosis or aortic aneurysm.  EKG:  EKG is  ordered today.  The ekg ordered today demonstrates normal sinus rhythm, QS pattern in leads V1-V2, no ST or T wave changes, normal QTc 420 ms.  Recent Labs: 11/01/2022: BUN 9; Creatinine, Ser 0.61; Hemoglobin 14.4; Platelets 238; Potassium 3.2; Sodium 141 11/02/2022: ALT 16  Recent Lipid Panel No results found for: "CHOL", "TRIG", "HDL", "CHOLHDL", "VLDL", "LDLCALC", "LDLDIRECT" 09/08/2022 Cholesterol  173, HDL 52, LDL 104, triglycerides 84 LDL particle number 1796, small LDL 790.  Risk Assessment/Calculations:       Physical Exam:    VS:  BP (!) 141/83 (BP Location: Left Arm, Patient Position: Sitting, Cuff Size: Normal)   Pulse 76   Ht '5\' 3"'$  (1.6 m)   Wt 132 lb 9.6 oz (60.1 kg)   SpO2 96%   BMI 23.49 kg/m     Wt Readings from Last 3 Encounters:  11/25/22 132 lb 9.6 oz (60.1 kg)  10/21/22 134 lb 4 oz (60.9 kg)  05/07/21 163 lb (73.9 kg)     GEN:  Well nourished, well developed in no acute distress HEENT: Normal NECK: No JVD; No carotid bruits LYMPHATICS: No lymphadenopathy CARDIAC: RRR, no murmurs, rubs, gallops RESPIRATORY:  Clear to auscultation without rales, wheezing or rhonchi  ABDOMEN: Soft, non-tender, non-distended MUSCULOSKELETAL:  No edema; No deformity  SKIN: Warm and dry NEUROLOGIC:  Alert and oriented x 3 PSYCHIATRIC:  Normal affect   ASSESSMENT:    1. Chest pain of uncertain etiology   2. Renal hypertension   3. Hypercholesterolemia    PLAN:    In order of problems listed above:  Precordial pain: Strong family history of early onset CAD and adverse LDL particle number.  Will schedule for coronary CT angiogram.  Even if we do not find severe stenosis, this will inform LDL lowering strategy/target. HTN: The presence  of hypokalemia and erratic blood pressure suggest possible renal artery stenosis.  Check duplex ultrasound.  Also check catecholamines for pheochromocytoma and renin/aldosterone ratio. HLP: Currently on rosuvastatin.  May need to increase the dose to achieve target LDL less than 100 and particle number less than 1000.           Medication Adjustments/Labs and Tests Ordered: Current medicines are reviewed at length with the patient today.  Concerns regarding medicines are outlined above.  Orders Placed This Encounter  Procedures   CT CORONARY MORPH W/CTA COR W/SCORE W/CA W/CM &/OR WO/CM   Catecholamines, fractionated, plasma   Aldosterone + renin activity w/ ratio   EKG 12-Lead   VAS US RENAL ARTERY DUPLEX   Meds ordered this encounter  Medications   metoprolol tartrate (LOPRESSOR) 100 MG tablet    Sig: Take 1 tablet by mouth once for procedure.    Dispense:  1 tablet    Refill:  0    Patient Instructions  Medication Instructions:  Take Metoprolol 100 mg two hours before CT when scheduled.   *If you need a refill on your cardiac medications before your next appointment, please call your pharmacy*   Lab Work: Aldosterone and Renin, Catecholamines, today   If you have labs (blood work) drawn today and your tests are completely normal, you will receive your results only by: [place] (if you have MyChart) OR A paper copy in the mail If you have any lab test that is abnormal or we need to change your treatment, we will call you to review the results.   Testing/Procedures: Coronary CTA  Your physician has requested that you have a renal artery duplex. During this test, an ultrasound is used to evaluate blood flow to the kidneys. Allow one hour for this exam. Do not eat after midnight the day before and avoid carbonated beverages. Take your medications as you usually do.   Follow-Up: At [facility], you and your health needs are our priority.  As part of our  continuing mission to provide you  with exceptional heart care, we have created designated Provider Care Teams.  These Care Teams include your primary Cardiologist (physician) and Advanced Practice Providers (APPs -  Physician Assistants and Nurse Practitioners) who all work together to provide you with the care you need, when you need it.  We recommend signing up for the patient portal called "MyChart".  Sign up information is provided on this After Visit Summary.  MyChart is used to connect with patients for Virtual Visits (Telemedicine).  Patients are able to view lab/test results, encounter notes, upcoming appointments, etc.  Non-urgent messages can be sent to your provider as well.   To learn more about what you can do with MyChart, go to [email or web address].    Your next appointment:   2-3 month(s)  The format for your next appointment:   In Person  Provider:   Sanda Klein, MD  Other Instructions   Your cardiac CT will be scheduled at one of the below locations:   [facility] [address] [place], [place] [address] [phone]  If scheduled at [facility], please arrive at the [facility] and Children's Entrance (Entrance C2) of [facility] 30 minutes prior to test start time. You can use the FREE valet parking offered at entrance C (encouraged to control the heart rate for the test)  Proceed to the [facility] Radiology Department (first floor) to check-in and test prep.  All radiology patients and guests should use entrance C2 at [facility], accessed from [facility], even though the hospital's physical address listed is [address].     Please follow these instructions carefully (unless otherwise directed):   On the Night Before the Test: Be sure to Drink plenty of water. Do not consume any caffeinated/decaffeinated beverages or chocolate 12 hours prior to your test. Do not take any antihistamines 12  hours prior to your test.  On the Day of the Test: Drink plenty of water until 1 hour prior to the test. Do not eat any food 1 hour prior to test. You may take your regular medications prior to the test.  Take metoprolol (Lopressor) two hours prior to test. HOLD Furosemide/Hydrochlorothiazide morning of the test. FEMALES- please wear underwire-free bra if available, avoid dresses & tight clothing       After the Test: Drink plenty of water. After receiving IV contrast, you may experience a mild flushed feeling. This is normal. On occasion, you may experience a mild rash up to 24 hours after the test. This is not dangerous. If this occurs, you can take Benadryl 25 mg and increase your fluid intake. If you experience trouble breathing, this can be serious. If it is severe call 911 IMMEDIATELY. If it is mild, please call our office. If you take any of these medications: Glipizide/Metformin, Avandament, Glucavance, please do not take 48 hours after completing test unless otherwise instructed.  We will call to schedule your test 2-4 weeks out understanding that some insurance companies will need an authorization prior to the service being performed.   For non-scheduling related questions, please contact the cardiac imaging nurse navigator should you have any questions/concerns: Marchia Bond, Cardiac Imaging Nurse Navigator Gordy Clement, Cardiac Imaging Nurse Navigator [facility] Direct Office Dial: [phone]   For scheduling needs, including cancellations and rescheduling, please call [place], [phone].            Signed, Sanda Klein, MD  12/02/2022 7:35 PM    [facility]

## 2022-11-25 NOTE — Patient Instructions (Addendum)
Medication Instructions:  Take Metoprolol 100 mg two hours before CT when scheduled.   *If you need a refill on your cardiac medications before your next appointment, please call your pharmacy*   Lab Work: Aldosterone and Renin, Catecholamines, today   If you have labs (blood work) drawn today and your tests are completely normal, you will receive your results only by: [place] (if you have MyChart) OR A paper copy in the mail If you have any lab test that is abnormal or we need to change your treatment, we will call you to review the results.   Testing/Procedures: Coronary CTA  Your physician has requested that you have a renal artery duplex. During this test, an ultrasound is used to evaluate blood flow to the kidneys. Allow one hour for this exam. Do not eat after midnight the day before and avoid carbonated beverages. Take your medications as you usually do.   Follow-Up: At [facility], you and your health needs are our priority.  As part of our continuing mission to provide you with exceptional heart care, we have created designated Provider Care Teams.  These Care Teams include your primary Cardiologist (physician) and Advanced Practice Providers (APPs -  Physician Assistants and Nurse Practitioners) who all work together to provide you with the care you need, when you need it.  We recommend signing up for the patient portal called "MyChart".  Sign up information is provided on this After Visit Summary.  MyChart is used to connect with patients for Virtual Visits (Telemedicine).  Patients are able to view lab/test results, encounter notes, upcoming appointments, etc.  Non-urgent messages can be sent to your provider as well.   To learn more about what you can do with MyChart, go to [email or web address].    Your next appointment:   2-3 month(s)  The format for your next appointment:   In Person  Provider:   Sanda Klein, MD  Other Instructions   Your  cardiac CT will be scheduled at one of the below locations:   [facility] [address] [place], [place] [address] [phone]  If scheduled at [facility], please arrive at the [facility] and Children's Entrance (Entrance C2) of [facility] 30 minutes prior to test start time. You can use the FREE valet parking offered at entrance C (encouraged to control the heart rate for the test)  Proceed to the [facility] Radiology Department (first floor) to check-in and test prep.  All radiology patients and guests should use entrance C2 at [facility], accessed from [facility], even though the hospital's physical address listed is [address].     Please follow these instructions carefully (unless otherwise directed):   On the Night Before the Test: Be sure to Drink plenty of water. Do not consume any caffeinated/decaffeinated beverages or chocolate 12 hours prior to your test. Do not take any antihistamines 12 hours prior to your test.  On the Day of the Test: Drink plenty of water until 1 hour prior to the test. Do not eat any food 1 hour prior to test. You may take your regular medications prior to the test.  Take metoprolol (Lopressor) two hours prior to test. HOLD Furosemide/Hydrochlorothiazide morning of the test. FEMALES- please wear underwire-free bra if available, avoid dresses & tight clothing       After the Test: Drink plenty of water. After receiving IV contrast, you may experience a mild flushed feeling. This is  normal. On occasion, you may experience a mild rash up to 24 hours after the test. This is not dangerous. If this occurs, you can take Benadryl 25 mg and increase your fluid intake. If you experience trouble breathing, this can be serious. If it is severe call 911 IMMEDIATELY. If it is mild, please call our office. If you take any of these medications: Glipizide/Metformin, Avandament, Glucavance, please  do not take 48 hours after completing test unless otherwise instructed.  We will call to schedule your test 2-4 weeks out understanding that some insurance companies will need an authorization prior to the service being performed.   For non-scheduling related questions, please contact the cardiac imaging nurse navigator should you have any questions/concerns: Marchia Bond, Cardiac Imaging Nurse Navigator Gordy Clement, Cardiac Imaging Nurse Navigator [facility] Direct Office Dial: [phone]   For scheduling needs, including cancellations and rescheduling, please call [place], [phone].

## 2022-11-27 ENCOUNTER — Telehealth: Payer: Self-pay | Admitting: Cardiovascular Disease

## 2022-11-27 NOTE — Telephone Encounter (Signed)
Spoke with the patient. She was calling for the results of her lab work completed on 12/19. She stated that she had them collected at the [facility] office lab. She has been advised that the results are not in.

## 2022-11-27 NOTE — Telephone Encounter (Signed)
Patient is calling requesting her lab results. She would like them released to [place] as well as a call. Please advise.

## 2022-11-29 LAB — CATECHOLAMINES, FRACTIONATED, PLASMA
Dopamine: <30 pg/mL (ref 0–48)
Epinephrine: 35 pg/mL (ref 0–62)
Norepinephrine: 615 pg/mL (ref 0–874)

## 2022-11-29 LAB — ALDOSTERONE + RENIN ACTIVITY W/ RATIO
Aldos/Renin Ratio: 6.6 (ref 0.0–30.0)
Aldosterone: 6.4 ng/dL (ref 0.0–30.0)
Renin Activity, Plasma: 0.971 ng/mL/hr (ref 0.167–5.380)

## 2022-12-02 ENCOUNTER — Ambulatory Visit (HOSPITAL_COMMUNITY)
Admission: RE | Admit: 2022-12-02 | Discharge: 2022-12-02 | Disposition: A | Discharge status: Home / Self Care | Payer: BC Managed Care – PPO | Source: Ambulatory Visit | Attending: Cardiovascular Disease | Admitting: Cardiovascular Disease

## 2022-12-02 DIAGNOSIS — I129 Hypertensive chronic kidney disease with stage 1 through stage 4 chronic kidney disease, or unspecified chronic kidney disease: Secondary | ICD-10-CM | POA: Diagnosis not present

## 2022-12-03 ENCOUNTER — Other Ambulatory Visit: Payer: Self-pay

## 2022-12-03 ENCOUNTER — Telehealth (HOSPITAL_COMMUNITY): Payer: Self-pay | Admitting: Emergency Medicine

## 2022-12-03 DIAGNOSIS — R079 Chest pain, unspecified: Secondary | ICD-10-CM

## 2022-12-03 DIAGNOSIS — I129 Hypertensive chronic kidney disease with stage 1 through stage 4 chronic kidney disease, or unspecified chronic kidney disease: Secondary | ICD-10-CM

## 2022-12-03 MED ORDER — METOPROLOL TARTRATE 100 MG PO TABS: ORAL_TABLET | ORAL | 0 refills | Status: DC

## 2022-12-03 NOTE — Addendum Note (Signed)
Addended by: Sanda Klein on: 12/03/2022 09:53 AM   Modules accepted: Orders

## 2022-12-03 NOTE — Telephone Encounter (Signed)
Reaching out to patient to offer assistance regarding upcoming cardiac imaging study; pt verbalizes understanding of appt date/time, parking situation and where to check in, pre-test NPO status and medications ordered, and verified current allergies; name and call back number provided for further questions should they arise Marchia Bond RN Navigator Cardiac Imaging [facility] [phone] office [phone] cell  Arrival 900 '100mg'$  metoprolol tartrate (resent to pharm since patient took original dose yesterday before [place]) Denies Iv issues

## 2022-12-04 ENCOUNTER — Ambulatory Visit (HOSPITAL_COMMUNITY)
Admission: RE | Admit: 2022-12-04 | Discharge: 2022-12-04 | Disposition: A | Discharge status: Home / Self Care | Payer: BC Managed Care – PPO | Source: Ambulatory Visit | Attending: Cardiovascular Disease | Admitting: Cardiovascular Disease

## 2022-12-04 DIAGNOSIS — R079 Chest pain, unspecified: Secondary | ICD-10-CM | POA: Insufficient documentation

## 2022-12-04 MED ORDER — NITROGLYCERIN 0.4 MG SL SUBL
0.8000 mg | SUBLINGUAL_TABLET | Freq: Once | SUBLINGUAL | Status: AC
  Administered 2022-12-04: 0.8 mg via SUBLINGUAL

## 2022-12-04 MED ORDER — IOHEXOL 350 MG/ML SOLN
95.0000 mL | Freq: Once | INTRAVENOUS | Status: AC | PRN
  Administered 2022-12-04: 95 mL via INTRAVENOUS

## 2022-12-04 MED ORDER — METOPROLOL TARTRATE 5 MG/5ML IV SOLN
INTRAVENOUS | Status: AC
  Filled 2022-12-04: qty 5

## 2022-12-04 MED ORDER — METOPROLOL TARTRATE 5 MG/5ML IV SOLN
5.0000 mg | Freq: Once | INTRAVENOUS | Status: AC
  Administered 2022-12-04: 5 mg via INTRAVENOUS

## 2022-12-04 MED ORDER — NITROGLYCERIN 0.4 MG SL SUBL
SUBLINGUAL_TABLET | SUBLINGUAL | Status: AC
  Filled 2022-12-04: qty 2

## 2022-12-04 NOTE — Telephone Encounter (Signed)
Please see results encounter

## 2022-12-09 DIAGNOSIS — D3500 Benign neoplasm of unspecified adrenal gland: Secondary | ICD-10-CM | POA: Diagnosis not present

## 2022-12-10 ENCOUNTER — Ambulatory Visit: Payer: Self-pay | Admitting: Internal Medicine

## 2022-12-10 DIAGNOSIS — T1511XA Foreign body in conjunctival sac, right eye, initial encounter: Secondary | ICD-10-CM | POA: Diagnosis not present

## 2022-12-10 DIAGNOSIS — H04123 Dry eye syndrome of bilateral lacrimal glands: Secondary | ICD-10-CM | POA: Diagnosis not present

## 2022-12-10 DIAGNOSIS — H10413 Chronic giant papillary conjunctivitis, bilateral: Secondary | ICD-10-CM | POA: Diagnosis not present

## 2022-12-15 LAB — VANILLYLMANDELIC ACID, 24-HR U
VMA, 24H Ur Adult: 3.4 mg/24 hr (ref 0.0–7.5)
VMA, Urine: 1.9 mg/L

## 2022-12-15 LAB — METANEPHRINES, URINE, 24 HOUR
Metaneph Total, Ur: 36 ug/L
Metanephrines, 24H Ur: 65 ug/24 hr (ref 36–209)
Normetanephrine, 24H Ur: 166 ug/24 hr (ref 131–612)
Normetanephrine, Ur: 92 ug/L

## 2023-01-02 DIAGNOSIS — Z6823 Body mass index (BMI) 23.0-23.9, adult: Secondary | ICD-10-CM | POA: Diagnosis not present

## 2023-01-02 DIAGNOSIS — Z01419 Encounter for gynecological examination (general) (routine) without abnormal findings: Secondary | ICD-10-CM | POA: Diagnosis not present

## 2023-01-02 DIAGNOSIS — Z1272 Encounter for screening for malignant neoplasm of vagina: Secondary | ICD-10-CM | POA: Diagnosis not present

## 2023-01-09 DIAGNOSIS — H04123 Dry eye syndrome of bilateral lacrimal glands: Secondary | ICD-10-CM | POA: Diagnosis not present

## 2023-01-09 DIAGNOSIS — H16223 Keratoconjunctivitis sicca, not specified as Sjogren's, bilateral: Secondary | ICD-10-CM | POA: Diagnosis not present

## 2023-01-12 ENCOUNTER — Encounter: Payer: Self-pay | Admitting: Cardiovascular Disease

## 2023-01-12 ENCOUNTER — Ambulatory Visit: Payer: BC Managed Care – PPO | Attending: Cardiovascular Disease | Admitting: Cardiovascular Disease

## 2023-01-12 VITALS — BP 132/86 | HR 76 | Wt 135.0 lb

## 2023-01-12 DIAGNOSIS — I10 Essential (primary) hypertension: Secondary | ICD-10-CM | POA: Diagnosis not present

## 2023-01-12 DIAGNOSIS — E7800 Pure hypercholesterolemia, unspecified: Secondary | ICD-10-CM

## 2023-01-12 MED ORDER — AMLODIPINE BESYLATE 2.5 MG PO TABS: 2.5000 mg | ORAL_TABLET | Freq: Every day | ORAL | 3 refills | Status: DC

## 2023-01-12 NOTE — Progress Notes (Signed)
Cardiology Office Note:    Date:  01/14/2023   ID:  Megan Castillo, DOB 1966/09/22, MRN [id number]  PCP:  [name], [place] Providers Cardiologist:  None     Referring MD: [name], MD   Chief Complaint  Patient presents with   Hypertension     History of Present Illness:    Megan Castillo is a 57 y.o. female with a hx of migraine headaches, treated hypothyroidism mild dyslipidemia and recent development of hypertension.  She has had a remarkably good response to amlodipine 2.5 mg taken once daily.  At home her blood pressure is typically in the 110s/70s and never higher than 130/80.  Her blood pressure is borderline elevated today at 132/86.  She feels much better.  Since her hypertension had a relatively late age of onset and some severely elevated readings we performed workup for secondary causes of hypertension.  There is no evidence of renal artery stenosis, hyperaldosterone anemia, or pheochromocytoma.  Due to some complaints of atypical chest pain we also performed a coronary CT angiogram.  There were no coronary stenoses or abnormalities of the thoracic aorta.  The coronary calcium score was 0.  The patient specifically denies any chest pain at rest exertion, dyspnea at rest or with exertion, orthopnea, paroxysmal nocturnal dyspnea, syncope, palpitations, focal neurological deficits, intermittent claudication, lower extremity edema, unexplained weight gain, cough, hemoptysis or wheezing.   She has been taking Ozempic and her BMI is down to 23.  She does not smoke.  Although her standard lipid profile is not bad, with total cholesterol 173 and LDL 104, she has a high LDL particle number at 1796 and a high particle number of small LDL at 790.  She is taking rosuvastatin 10 mg daily, started after these labs drawn.  She has normal renal function.  Past Medical History:  Diagnosis Date   Arthritis    Constipation    Hemorrhoids     Migraine    Rectal pain     Past Surgical History:  Procedure Laterality Date   ABDOMINAL HYSTERECTOMY  11/2009   BREAST REDUCTION SURGERY Bilateral 04/25/2021   Procedure: BREAST REDUCTION WITH LIPOSUCTION;  Surgeon: Wallace Going, DO;  Location: [facility];  Service: Plastics;  Laterality: Bilateral;  3 hours   HEMORRHOID SURGERY     NASAL RECONSTRUCTION      Current Medications: Current Meds  Medication Sig   amLODipine (NORVASC) 2.5 MG tablet Take 1 tablet (2.5 mg total) by mouth daily.   cholecalciferol (VITAMIN D3) 25 MCG (1000 UNIT) tablet Take 1,000 Units by mouth daily.   cyanocobalamin (VITAMIN B12) 1000 MCG tablet Take 1,000 mcg by mouth daily.   EYSUVIS 0.25 % SUSP Place 1 drop into both eyes 2 (two) times daily. Use four times daily for two weeks   NP THYROID 30 MG tablet Take 30 mg by mouth daily.   Probiotic Product (ALIGN PO) Take 1 capsule by mouth daily.   rizatriptan (MAXALT) 5 MG tablet Take 5 mg by mouth as needed for migraine. May repeat in 2 hours if needed   rosuvastatin (CRESTOR) 10 MG tablet Take 10 mg by mouth daily.   Semaglutide, 1 MG/DOSE, (OZEMPIC, 1 MG/DOSE,) 4 MG/3ML SOPN Inject 1 mg into the skin once a week. Saturday   tretinoin (RETIN-A) 0.025 % cream Apply 1 Application topically at bedtime as needed (dry skin).   [DISCONTINUED] amLODipine (NORVASC) 5 MG tablet Take 5 mg by  mouth daily.     Allergies:   Bee pollen   Social History   Socioeconomic History   Marital status: Married    Spouse name: Elta Guadeloupe   Number of children: 2   Years of education: 13 1/2   Highest education level: Not on file  Occupational History    Comment: Journalist, newspaper  Tobacco Use   Smoking status: Never   Smokeless tobacco: Never  Substance and Sexual Activity   Alcohol use: No   Drug use: No   Sexual activity: Yes  Other Topics Concern   Not on file  Social History Narrative   ** Merged History Encounter **       Lives at home  with husband Caffeine use-tea twice a day   Social Determinants of Health   Financial Resource Strain: Not on file  Food Insecurity: Not on file  Transportation Needs: Not on file  Physical Activity: Not on file  Stress: Not on file  Social Connections: Not on file     Family History: The patient's family history includes Cancer (age of onset: 36) in her brother; Heart disease in her mother.  ROS:   Please see the history of present illness.     All other systems reviewed and are negative.  EKGs/Labs/Other Studies Reviewed:    The following studies were reviewed today: CT of the abdomen-pelvis 10/01/2021 and MRI of the abdomen 10/26/2021 does not show aortic atherosclerosis or aortic aneurysm.  EKG:  EKG is  ordered today.  The ekg ordered today demonstrates normal sinus rhythm, QS pattern in leads V1-V2, no ST or T wave changes, normal QTc 420 ms.  Recent Labs: 11/01/2022: BUN 9; Creatinine, Ser 0.61; Hemoglobin 14.4; Platelets 238; Potassium 3.2; Sodium 141 11/02/2022: ALT 16  Recent Lipid Panel No results found for: "CHOL", "TRIG", "HDL", "CHOLHDL", "VLDL", "LDLCALC", "LDLDIRECT" 09/08/2022 Cholesterol 173, HDL 52, LDL 104, triglycerides 84 LDL particle number 1796, small LDL 790.  Risk Assessment/Calculations:       Physical Exam:    VS:  BP 132/86 (BP Location: Left Arm, Patient Position: Sitting, Cuff Size: Normal)   Pulse 76   Wt 135 lb (61.2 kg)   SpO2 99%   BMI 23.91 kg/m     Wt Readings from Last 3 Encounters:  01/12/23 135 lb (61.2 kg)  11/25/22 132 lb 9.6 oz (60.1 kg)  10/21/22 134 lb 4 oz (60.9 kg)     General: Alert, oriented x3, no distress, appears lean and fit Head: no evidence of trauma, PERRL, EOMI, no exophtalmos or lid lag, no myxedema, no xanthelasma; normal ears, nose and oropharynx Neck: normal jugular venous pulsations and no hepatojugular reflux; brisk carotid pulses without delay and no carotid bruits Chest: clear to auscultation,  no signs of consolidation by percussion or palpation, normal fremitus, symmetrical and full respiratory excursions Cardiovascular: normal position and quality of the apical impulse, regular rhythm, normal first and second heart sounds, no murmurs, rubs or gallops Abdomen: no tenderness or distention, no masses by palpation, no abnormal pulsatility or arterial bruits, normal bowel sounds, no hepatosplenomegaly Extremities: no clubbing, cyanosis or edema; 2+ radial, ulnar and brachial pulses bilaterally; 2+ right femoral, posterior tibial and dorsalis pedis pulses; 2+ left femoral, posterior tibial and dorsalis pedis pulses; no subclavian or femoral bruits Neurological: grossly nonfocal Psych: Normal mood and affect   ASSESSMENT:    1. Essential hypertension   2. Hypercholesterolemia     PLAN:    In order of problems listed above:  Precordial pain: Resolved.  Normal findings on coronary CT angiogram and calcium score of 0.   HTN: Now well-controlled.  Workup for secondary causes of hypertension did not yield evidence of hyperaldosteronism renal artery stenosis or pheochromocytoma. HLP: On rosuvastatin.  Labs to be repeated in Dr. Mathis Fare office.  Calcium score was 0, but she does have a family history of early onset CAD and an unfavorable lipid profile with elevated LDL particle number and small LDL size.           Medication Adjustments/Labs and Tests Ordered: Current medicines are reviewed at length with the patient today.  Concerns regarding medicines are outlined above.  No orders of the defined types were placed in this encounter.  Meds ordered this encounter  Medications   amLODipine (NORVASC) 2.5 MG tablet    Sig: Take 1 tablet (2.5 mg total) by mouth daily.    Dispense:  90 tablet    Refill:  3    Patient Instructions  Medication Instructions:  Amlodipine 2.'5mg'$  daily *If you need a refill on your cardiac medications before your next appointment, please call your  pharmacy*  Follow-Up: At [facility], you and your health needs are our priority.  As part of our continuing mission to provide you with exceptional heart care, we have created designated Provider Care Teams.  These Care Teams include your primary Cardiologist (physician) and Advanced Practice Providers (APPs -  Physician Assistants and Nurse Practitioners) who all work together to provide you with the care you need, when you need it.  We recommend signing up for the patient portal called "MyChart".  Sign up information is provided on this After Visit Summary.  MyChart is used to connect with patients for Virtual Visits (Telemedicine).  Patients are able to view lab/test results, encounter notes, upcoming appointments, etc.  Non-urgent messages can be sent to your provider as well.   To learn more about what you can do with MyChart, go to [email or web address].    Your next appointment:   1 year(s)  Provider:   Dr Sallyanne Kuster     Signed, Sanda Klein, MD  01/14/2023 7:53 AM    [place]

## 2023-01-12 NOTE — Patient Instructions (Signed)
Medication Instructions:  Amlodipine 2.'5mg'$  daily *If you need a refill on your cardiac medications before your next appointment, please call your pharmacy*  Follow-Up: At [facility], you and your health needs are our priority.  As part of our continuing mission to provide you with exceptional heart care, we have created designated Provider Care Teams.  These Care Teams include your primary Cardiologist (physician) and Advanced Practice Providers (APPs -  Physician Assistants and Nurse Practitioners) who all work together to provide you with the care you need, when you need it.  We recommend signing up for the patient portal called "MyChart".  Sign up information is provided on this After Visit Summary.  MyChart is used to connect with patients for Virtual Visits (Telemedicine).  Patients are able to view lab/test results, encounter notes, upcoming appointments, etc.  Non-urgent messages can be sent to your provider as well.   To learn more about what you can do with MyChart, go to [email or web address].    Your next appointment:   1 year(s)  Provider:   Dr Sallyanne Kuster

## 2023-01-14 ENCOUNTER — Encounter: Payer: Self-pay | Admitting: Cardiovascular Disease

## 2023-01-19 DIAGNOSIS — E782 Mixed hyperlipidemia: Secondary | ICD-10-CM | POA: Diagnosis not present

## 2023-01-19 DIAGNOSIS — E0590 Thyrotoxicosis, unspecified without thyrotoxic crisis or storm: Secondary | ICD-10-CM | POA: Diagnosis not present

## 2023-01-23 DIAGNOSIS — Z1231 Encounter for screening mammogram for malignant neoplasm of breast: Secondary | ICD-10-CM | POA: Diagnosis not present

## 2023-01-26 DIAGNOSIS — Z0000 Encounter for general adult medical examination without abnormal findings: Secondary | ICD-10-CM | POA: Diagnosis not present

## 2023-01-26 DIAGNOSIS — E782 Mixed hyperlipidemia: Secondary | ICD-10-CM | POA: Diagnosis not present

## 2023-01-26 DIAGNOSIS — E0590 Thyrotoxicosis, unspecified without thyrotoxic crisis or storm: Secondary | ICD-10-CM | POA: Diagnosis not present

## 2023-01-26 DIAGNOSIS — I10 Essential (primary) hypertension: Secondary | ICD-10-CM | POA: Diagnosis not present

## 2023-01-27 DIAGNOSIS — H02883 Meibomian gland dysfunction of right eye, unspecified eyelid: Secondary | ICD-10-CM | POA: Diagnosis not present

## 2023-01-27 DIAGNOSIS — H16223 Keratoconjunctivitis sicca, not specified as Sjogren's, bilateral: Secondary | ICD-10-CM | POA: Diagnosis not present

## 2023-01-27 DIAGNOSIS — H02886 Meibomian gland dysfunction of left eye, unspecified eyelid: Secondary | ICD-10-CM | POA: Diagnosis not present

## 2023-01-27 DIAGNOSIS — H04123 Dry eye syndrome of bilateral lacrimal glands: Secondary | ICD-10-CM | POA: Diagnosis not present

## 2023-01-30 DIAGNOSIS — R928 Other abnormal and inconclusive findings on diagnostic imaging of breast: Secondary | ICD-10-CM | POA: Diagnosis not present

## 2023-01-30 DIAGNOSIS — N6002 Solitary cyst of left breast: Secondary | ICD-10-CM | POA: Diagnosis not present

## 2023-02-04 DIAGNOSIS — N6323 Unspecified lump in the left breast, lower outer quadrant: Secondary | ICD-10-CM | POA: Diagnosis not present

## 2023-02-05 DIAGNOSIS — Z808 Family history of malignant neoplasm of other organs or systems: Secondary | ICD-10-CM | POA: Diagnosis not present

## 2023-02-05 DIAGNOSIS — D225 Melanocytic nevi of trunk: Secondary | ICD-10-CM | POA: Diagnosis not present

## 2023-02-05 DIAGNOSIS — X32XXXS Exposure to sunlight, sequela: Secondary | ICD-10-CM | POA: Diagnosis not present

## 2023-02-05 DIAGNOSIS — L918 Other hypertrophic disorders of the skin: Secondary | ICD-10-CM | POA: Diagnosis not present

## 2023-03-06 DIAGNOSIS — H02883 Meibomian gland dysfunction of right eye, unspecified eyelid: Secondary | ICD-10-CM | POA: Diagnosis not present

## 2023-03-06 DIAGNOSIS — H16223 Keratoconjunctivitis sicca, not specified as Sjogren's, bilateral: Secondary | ICD-10-CM | POA: Diagnosis not present

## 2023-03-06 DIAGNOSIS — H02886 Meibomian gland dysfunction of left eye, unspecified eyelid: Secondary | ICD-10-CM | POA: Diagnosis not present

## 2023-03-06 DIAGNOSIS — H524 Presbyopia: Secondary | ICD-10-CM | POA: Diagnosis not present

## 2023-03-06 DIAGNOSIS — H04123 Dry eye syndrome of bilateral lacrimal glands: Secondary | ICD-10-CM | POA: Diagnosis not present

## 2023-04-03 DIAGNOSIS — L821 Other seborrheic keratosis: Secondary | ICD-10-CM | POA: Diagnosis not present

## 2023-04-03 DIAGNOSIS — D485 Neoplasm of uncertain behavior of skin: Secondary | ICD-10-CM | POA: Diagnosis not present

## 2023-04-03 DIAGNOSIS — D2230 Melanocytic nevi of unspecified part of face: Secondary | ICD-10-CM | POA: Diagnosis not present

## 2023-04-03 DIAGNOSIS — L814 Other melanin hyperpigmentation: Secondary | ICD-10-CM | POA: Diagnosis not present

## 2023-04-15 DIAGNOSIS — L570 Actinic keratosis: Secondary | ICD-10-CM | POA: Diagnosis not present

## 2023-04-15 DIAGNOSIS — D485 Neoplasm of uncertain behavior of skin: Secondary | ICD-10-CM | POA: Diagnosis not present

## 2023-04-15 DIAGNOSIS — D229 Melanocytic nevi, unspecified: Secondary | ICD-10-CM | POA: Diagnosis not present

## 2023-04-15 DIAGNOSIS — L814 Other melanin hyperpigmentation: Secondary | ICD-10-CM | POA: Diagnosis not present

## 2023-04-27 DIAGNOSIS — K317 Polyp of stomach and duodenum: Secondary | ICD-10-CM | POA: Diagnosis not present

## 2023-04-27 DIAGNOSIS — K2100 Gastro-esophageal reflux disease with esophagitis, without bleeding: Secondary | ICD-10-CM | POA: Diagnosis not present

## 2023-04-27 DIAGNOSIS — K449 Diaphragmatic hernia without obstruction or gangrene: Secondary | ICD-10-CM | POA: Diagnosis not present

## 2023-04-27 DIAGNOSIS — R1013 Epigastric pain: Secondary | ICD-10-CM | POA: Diagnosis not present

## 2023-04-27 DIAGNOSIS — R12 Heartburn: Secondary | ICD-10-CM | POA: Diagnosis not present

## 2023-04-27 DIAGNOSIS — R110 Nausea: Secondary | ICD-10-CM | POA: Diagnosis not present

## 2023-04-28 ENCOUNTER — Ambulatory Visit: Payer: Self-pay | Admitting: Neurology

## 2023-06-01 DIAGNOSIS — H04123 Dry eye syndrome of bilateral lacrimal glands: Secondary | ICD-10-CM | POA: Diagnosis not present

## 2023-06-01 DIAGNOSIS — H01003 Unspecified blepharitis right eye, unspecified eyelid: Secondary | ICD-10-CM | POA: Diagnosis not present

## 2023-06-01 DIAGNOSIS — H018 Other specified inflammations of eyelid: Secondary | ICD-10-CM | POA: Diagnosis not present

## 2023-06-01 DIAGNOSIS — H01006 Unspecified blepharitis left eye, unspecified eyelid: Secondary | ICD-10-CM | POA: Diagnosis not present

## 2023-06-24 DIAGNOSIS — D2230 Melanocytic nevi of unspecified part of face: Secondary | ICD-10-CM | POA: Diagnosis not present

## 2023-06-24 DIAGNOSIS — M25551 Pain in right hip: Secondary | ICD-10-CM | POA: Diagnosis not present

## 2023-06-24 DIAGNOSIS — L905 Scar conditions and fibrosis of skin: Secondary | ICD-10-CM | POA: Diagnosis not present

## 2023-06-24 DIAGNOSIS — M545 Low back pain, unspecified: Secondary | ICD-10-CM | POA: Diagnosis not present

## 2023-06-24 DIAGNOSIS — L821 Other seborrheic keratosis: Secondary | ICD-10-CM | POA: Diagnosis not present

## 2023-06-24 DIAGNOSIS — B078 Other viral warts: Secondary | ICD-10-CM | POA: Diagnosis not present

## 2023-06-26 DIAGNOSIS — R1031 Right lower quadrant pain: Secondary | ICD-10-CM | POA: Diagnosis not present

## 2023-06-29 DIAGNOSIS — M9902 Segmental and somatic dysfunction of thoracic region: Secondary | ICD-10-CM | POA: Diagnosis not present

## 2023-06-29 DIAGNOSIS — M47816 Spondylosis without myelopathy or radiculopathy, lumbar region: Secondary | ICD-10-CM | POA: Diagnosis not present

## 2023-06-29 DIAGNOSIS — M9905 Segmental and somatic dysfunction of pelvic region: Secondary | ICD-10-CM | POA: Diagnosis not present

## 2023-06-29 DIAGNOSIS — M5137 Other intervertebral disc degeneration, lumbosacral region: Secondary | ICD-10-CM | POA: Diagnosis not present

## 2023-06-29 DIAGNOSIS — M9903 Segmental and somatic dysfunction of lumbar region: Secondary | ICD-10-CM | POA: Diagnosis not present

## 2023-06-29 DIAGNOSIS — M5386 Other specified dorsopathies, lumbar region: Secondary | ICD-10-CM | POA: Diagnosis not present

## 2023-07-02 DIAGNOSIS — M5386 Other specified dorsopathies, lumbar region: Secondary | ICD-10-CM | POA: Diagnosis not present

## 2023-07-02 DIAGNOSIS — M5137 Other intervertebral disc degeneration, lumbosacral region: Secondary | ICD-10-CM | POA: Diagnosis not present

## 2023-07-02 DIAGNOSIS — M47816 Spondylosis without myelopathy or radiculopathy, lumbar region: Secondary | ICD-10-CM | POA: Diagnosis not present

## 2023-07-02 DIAGNOSIS — M9903 Segmental and somatic dysfunction of lumbar region: Secondary | ICD-10-CM | POA: Diagnosis not present

## 2023-07-02 DIAGNOSIS — M9905 Segmental and somatic dysfunction of pelvic region: Secondary | ICD-10-CM | POA: Diagnosis not present

## 2023-07-29 ENCOUNTER — Ambulatory Visit: Payer: Self-pay | Admitting: Neurology

## 2023-08-05 DIAGNOSIS — M5459 Other low back pain: Secondary | ICD-10-CM | POA: Diagnosis not present

## 2023-08-06 DIAGNOSIS — H018 Other specified inflammations of eyelid: Secondary | ICD-10-CM | POA: Diagnosis not present

## 2023-08-06 DIAGNOSIS — H04123 Dry eye syndrome of bilateral lacrimal glands: Secondary | ICD-10-CM | POA: Diagnosis not present

## 2023-08-07 DIAGNOSIS — M5451 Vertebrogenic low back pain: Secondary | ICD-10-CM | POA: Diagnosis not present

## 2023-09-14 DIAGNOSIS — M5451 Vertebrogenic low back pain: Secondary | ICD-10-CM | POA: Diagnosis not present

## 2023-09-18 DIAGNOSIS — M5451 Vertebrogenic low back pain: Secondary | ICD-10-CM | POA: Diagnosis not present

## 2023-09-25 DIAGNOSIS — H02886 Meibomian gland dysfunction of left eye, unspecified eyelid: Secondary | ICD-10-CM | POA: Diagnosis not present

## 2023-09-25 DIAGNOSIS — H04122 Dry eye syndrome of left lacrimal gland: Secondary | ICD-10-CM | POA: Diagnosis not present

## 2023-09-25 DIAGNOSIS — H16221 Keratoconjunctivitis sicca, not specified as Sjogren's, right eye: Secondary | ICD-10-CM | POA: Diagnosis not present

## 2023-09-25 DIAGNOSIS — H16223 Keratoconjunctivitis sicca, not specified as Sjogren's, bilateral: Secondary | ICD-10-CM | POA: Diagnosis not present

## 2023-09-25 DIAGNOSIS — H04123 Dry eye syndrome of bilateral lacrimal glands: Secondary | ICD-10-CM | POA: Diagnosis not present

## 2023-09-25 DIAGNOSIS — H16222 Keratoconjunctivitis sicca, not specified as Sjogren's, left eye: Secondary | ICD-10-CM | POA: Diagnosis not present

## 2023-09-25 DIAGNOSIS — H04121 Dry eye syndrome of right lacrimal gland: Secondary | ICD-10-CM | POA: Diagnosis not present

## 2023-09-25 DIAGNOSIS — H02883 Meibomian gland dysfunction of right eye, unspecified eyelid: Secondary | ICD-10-CM | POA: Diagnosis not present

## 2023-10-06 ENCOUNTER — Telehealth: Payer: Self-pay | Admitting: Pharmacy Technician

## 2023-10-06 NOTE — Telephone Encounter (Addendum)
Pharmacy Patient Advocate Encounter   Received notification from CoverMyMeds that prior authorization for Nurtec 75MG  dispersible tablets is required/requested.   Insurance verification completed.   The patient is insured through [facility] .   Per test claim: PA required; PA started via CoverMyMeds. KEY [id number] . Waiting for clinical questions to populate.

## 2023-10-13 NOTE — Progress Notes (Unsigned)
Patient: Megan Castillo Date of Birth: 1966-11-06  Reason for Visit: Follow up History from: Patient Primary Neurologist: Pen[place]   ASSESSMENT AND PLAN 57 y.o. year old female   Chronic migraine headache   -Under excellent control, continue Nurtec 75 mg as needed, works much better than Imitrex -Next steps: If migraines increase consider propranolol or amitriptyline if daily preventative is needed -Follow-up in 1 year with me virtually   2.  Chronic left ptosis -Improved after eyelid lift -Continues to see ophthalmology for chronic dry eye  Meds ordered this encounter  Medications   Rimegepant Sulfate (NURTEC) 75 MG TBDP    Sig: Take 1 tablet (75 mg total) by mouth daily as needed.    Dispense:  8 tablet    Refill:  11   HISTORY OF PRESENT ILLNESS: Today 10/14/23 Update 10/14/23 SS: Some trouble with dry eyes, has seen specialist at [facility]. Uses Nurtec PRN, migraine frequency varies, could be none up to 2-3 times a month. 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   HISTORY  UPDATE (10/21/22, VRP): Since last visit, doing fair; 2 HA per month; sumatriptan helps, but now takes up 3-5 hours to help. Usually goes to sleep.    UPDATE (05/07/21, VRP): Since last visit, doing well. Symptoms are stable. HA 2-3 per month. Tolerating sumatriptan 50mg  as needed (breaks 100mg  capsule in half). Left ptosis stable. Saw ophthalmology and no specific cause found.   UPDATE (10/10/20, VRP): Since last visit, doing well. Symptoms are stable. 2 HA per month. Left ptosis (noted in 2018 ID photo) slightly more noticeable. No alleviating or aggravating factors.   UPDATE (10/10/19, VRP): Since last visit, now with HA every 4-6 weeks. Sumatriptan helps, but takes a few hours to help. No alleviating or aggravating factors. Tolerating sumatriptan.     UPDATE (10/04/18, VRP): Since last visit, doing well. Symptoms are mild. Severity is mild. No alleviating or aggravating factors. Tolerating sumatriptan 50.   Avg 1 HA every 6 months.   UPDATE (12/02/17, VRP): Since last visit, doing well for several months; 1 HA every 2-3 months. Then in Oct 2018 had 1 HA per week. Tolerating sumatriptan helps. No new alleviating or aggravating factors.    UPDATE 08/05/16: Since last visit, doing well with headaches (1 every month or other month). Sumatriptan 50mg  daily well. Still with high levels of stress (husband just d/c'd from hospital today for MI).    PRIOR HPI (03/03/16): 57 year old female here for evaluation of headaches. For past 3-4 years patient has had onset of unilateral, sometimes bilateral, periorbital severe intense headache and pain. Patient typically lays down, turns down the lights, uses warm washcloth to help relieve symptoms. Patient feels tired and foggy after headaches. Sometimes she has decreased peripheral vision and scotoma during the headaches. Patient has some sensitivity to light. No nausea or vomiting. She has some dizziness. She has some sinus pressure. Patient thought she had sinus headaches for many years and went to ENT recently for evaluation. Patient was told she may have migraine headaches and referred to me for further evaluation. Patient having approximately 2-4 days of migraine headache per month. Triggering factors include change of weather, change of season, decreased sleep. Patient tried sumatriptan in the past with mild relief.  REVIEW OF SYSTEMS: Out of a complete 14 system review of symptoms, the patient complains only of the following symptoms, and all other reviewed systems are negative.  See HPI  ALLERGIES: Allergies  Allergen Reactions   Bee Pollen Rash  HOME MEDICATIONS: Outpatient Medications Prior to Visit  Medication Sig Dispense Refill   amLODipine (NORVASC) 2.5 MG tablet Take 1 tablet (2.5 mg total) by mouth daily. 90 tablet 3   cholecalciferol (VITAMIN D3) 25 MCG (1000 UNIT) tablet Take 1,000 Units by mouth daily.     cyanocobalamin (VITAMIN B12) 1000 MCG  tablet Take 1,000 mcg by mouth daily.     EYSUVIS 0.25 % SUSP Place 1 drop into both eyes 2 (two) times daily. Use four times daily for two weeks     Probiotic Product (ALIGN PO) Take 1 capsule by mouth daily.     rosuvastatin (CRESTOR) 10 MG tablet Take 10 mg by mouth daily.     Semaglutide, 1 MG/DOSE, (OZEMPIC, 1 MG/DOSE,) 4 MG/3ML SOPN Inject 1 mg into the skin once a week. Saturday     tretinoin (RETIN-A) 0.025 % cream Apply 1 Application topically at bedtime as needed (dry skin).     Rimegepant Sulfate (NURTEC) 75 MG TBDP Take 75 mg by mouth daily as needed. 8 tablet 6   estradiol (VIVELLE-DOT) 0.1 MG/24HR patch Place 1 patch onto the skin 2 (two) times a week. (Patient not taking: Reported on 01/12/2023)     NP THYROID 30 MG tablet Take 30 mg by mouth daily.     rizatriptan (MAXALT) 5 MG tablet Take 5 mg by mouth as needed for migraine. May repeat in 2 hours if needed     No facility-administered medications prior to visit.    PAST MEDICAL HISTORY: Past Medical History:  Diagnosis Date   Arthritis    Constipation    Hemorrhoids    Migraine    Rectal pain     PAST SURGICAL HISTORY: Past Surgical History:  Procedure Laterality Date   ABDOMINAL HYSTERECTOMY  11/2009   BREAST REDUCTION SURGERY Bilateral 04/25/2021   Procedure: BREAST REDUCTION WITH LIPOSUCTION;  Surgeon: Peggye Form, DO;  Location: [facility];  Service: Plastics;  Laterality: Bilateral;  3 hours   HEMORRHOID SURGERY     NASAL RECONSTRUCTION      FAMILY HISTORY: Family History  Problem Relation Age of Onset   Heart disease Mother    Cancer Brother 79       Burkett lymphoma    SOCIAL HISTORY: Social History   Socioeconomic History   Marital status: Married    Spouse name: Merchant navy officer   Number of children: 2   Years of education: 13 1/2   Highest education level: Not on file  Occupational History    Comment: Scientist, water quality  Tobacco Use   Smoking status: Never   Smokeless  tobacco: Never  Substance and Sexual Activity   Alcohol use: No   Drug use: No   Sexual activity: Yes  Other Topics Concern   Not on file  Social History Narrative   ** Merged History Encounter **       Lives at home with husband Caffeine use-tea twice a day   Social Determinants of Health   Financial Resource Strain: Not on file  Food Insecurity: Not on file  Transportation Needs: Not on file  Physical Activity: Not on file  Stress: Not on file  Social Connections: Not on file  Intimate Partner Violence: Not on file    PHYSICAL EXAM  Vitals:   10/14/23 0826  BP: 122/84  Pulse: 84  Weight: 136 lb 9.6 oz (62 kg)  Height: 5\' 4"  (1.626 m)   Body mass index is 23.45 kg/m.  Generalized: Well  developed, in no acute distress  Neurological examination  Mentation: Alert oriented to time, place, history taking. Follows all commands speech and language fluent Cranial nerve II-XII: Pupils were equal round reactive to light. Extraocular movements were full, visual field were full on confrontational test. Facial sensation and strength were normal. Head turning and shoulder shrug  were normal and symmetric. Motor: The motor testing reveals 5 over 5 strength of all 4 extremities. Good symmetric motor tone is noted throughout.  Sensory: Sensory testing is intact to soft touch on all 4 extremities. No evidence of extinction is noted.  Coordination: Cerebellar testing reveals good finger-nose-finger and heel-to-shin bilaterally.  Gait and station: Gait is normal.   DIAGNOSTIC DATA (LABS, IMAGING, TESTING) - I reviewed patient records, labs, notes, testing and imaging myself where available.  Lab Results  Component Value Date   WBC 8.0 11/01/2022   HGB 14.4 11/01/2022   HCT 44.5 11/01/2022   MCV 92.5 11/01/2022   PLT 238 11/01/2022      Component Value Date/Time   NA 141 11/01/2022 1954   K 3.2 (L) 11/01/2022 1954   CL 105 11/01/2022 1954   CO2 25 11/01/2022 1954   GLUCOSE  103 (H) 11/01/2022 1954   BUN 9 11/01/2022 1954   CREATININE 0.61 11/01/2022 1954   CREATININE 0.70 04/10/2013 1702   CALCIUM 9.0 11/01/2022 1954   PROT 7.1 11/02/2022 0841   ALBUMIN 4.2 11/02/2022 0841   AST 20 11/02/2022 0841   ALT 16 11/02/2022 0841   ALKPHOS 91 11/02/2022 0841   BILITOT 0.5 11/02/2022 0841   GFRNONAA >60 11/01/2022 1954   GFRAA >60 11/01/2018 1858   No results found for: "CHOL", "HDL", "LDLCALC", "LDLDIRECT", "TRIG", "CHOLHDL" No results found for: "HGBA1C" No results found for: "VITAMINB12" Lab Results  Component Value Date   TSH 3.320 10/10/2020    Margie Ege, AGNP-C, DNP 10/14/2023, 9:00 AM [facility] [address], [address] [place], [place] [address] [phone]

## 2023-10-14 ENCOUNTER — Encounter: Payer: Self-pay | Admitting: Neurology

## 2023-10-14 ENCOUNTER — Other Ambulatory Visit (HOSPITAL_COMMUNITY): Payer: Self-pay

## 2023-10-14 ENCOUNTER — Ambulatory Visit: Payer: BC Managed Care – PPO | Admitting: Neurology

## 2023-10-14 VITALS — BP 122/84 | HR 84 | Ht 64.0 in | Wt 136.6 lb

## 2023-10-14 DIAGNOSIS — G43009 Migraine without aura, not intractable, without status migrainosus: Secondary | ICD-10-CM

## 2023-10-14 DIAGNOSIS — G43909 Migraine, unspecified, not intractable, without status migrainosus: Secondary | ICD-10-CM | POA: Insufficient documentation

## 2023-10-14 MED ORDER — NURTEC 75 MG PO TBDP: 75.0000 mg | ORAL_TABLET | Freq: Every day | ORAL | 11 refills | Status: DC | PRN

## 2023-10-14 NOTE — Telephone Encounter (Signed)
Clinical questions have been submitted-awaiting determination. 

## 2023-10-14 NOTE — Patient Instructions (Signed)
Great to meet you today!! Continue Nurtec as needed, I have sent a refill. Let me know if your migraines increase.  I will see you back in 1 year.  Thanks :)

## 2023-10-14 NOTE — Telephone Encounter (Signed)
PA expired-Resubmitting.  Pharmacy Patient Advocate Encounter   Received notification from CoverMyMeds that prior authorization for Nurtec 75MG  dispersible tablets is required/requested.   Insurance verification completed.   The patient is insured through [facility] .   Per test claim: PA required; PA started via CoverMyMeds. KEY [id number] . Waiting for clinical questions to populate.

## 2023-10-15 ENCOUNTER — Other Ambulatory Visit (HOSPITAL_COMMUNITY): Payer: Self-pay

## 2023-10-15 NOTE — Telephone Encounter (Signed)
Pharmacy Patient Advocate Encounter  Received notification from [facility] that Prior Authorization for Nurtec 75MG  dispersible tablets has been APPROVED from 10/14/2023 to 10/13/2024   PA #/Case ID/Reference #: [id number]

## 2023-11-12 DIAGNOSIS — H04123 Dry eye syndrome of bilateral lacrimal glands: Secondary | ICD-10-CM | POA: Diagnosis not present

## 2023-11-12 DIAGNOSIS — I10 Essential (primary) hypertension: Secondary | ICD-10-CM | POA: Diagnosis not present

## 2023-11-12 DIAGNOSIS — E782 Mixed hyperlipidemia: Secondary | ICD-10-CM | POA: Diagnosis not present

## 2023-11-12 DIAGNOSIS — R42 Dizziness and giddiness: Secondary | ICD-10-CM | POA: Diagnosis not present

## 2023-11-12 DIAGNOSIS — E0590 Thyrotoxicosis, unspecified without thyrotoxic crisis or storm: Secondary | ICD-10-CM | POA: Diagnosis not present

## 2023-11-16 DIAGNOSIS — Z0000 Encounter for general adult medical examination without abnormal findings: Secondary | ICD-10-CM | POA: Diagnosis not present

## 2023-11-16 DIAGNOSIS — R634 Abnormal weight loss: Secondary | ICD-10-CM | POA: Diagnosis not present

## 2023-11-16 DIAGNOSIS — E782 Mixed hyperlipidemia: Secondary | ICD-10-CM | POA: Diagnosis not present

## 2023-11-16 DIAGNOSIS — E0590 Thyrotoxicosis, unspecified without thyrotoxic crisis or storm: Secondary | ICD-10-CM | POA: Diagnosis not present

## 2023-11-23 DIAGNOSIS — R200 Anesthesia of skin: Secondary | ICD-10-CM | POA: Diagnosis not present

## 2023-11-23 DIAGNOSIS — I10 Essential (primary) hypertension: Secondary | ICD-10-CM | POA: Diagnosis not present

## 2023-11-23 DIAGNOSIS — G510 Bell's palsy: Secondary | ICD-10-CM | POA: Diagnosis not present

## 2023-11-23 DIAGNOSIS — R42 Dizziness and giddiness: Secondary | ICD-10-CM | POA: Diagnosis not present

## 2023-11-23 DIAGNOSIS — R220 Localized swelling, mass and lump, head: Secondary | ICD-10-CM | POA: Diagnosis not present

## 2023-11-24 ENCOUNTER — Encounter: Payer: Self-pay | Admitting: Neurology

## 2023-11-24 ENCOUNTER — Telehealth: Payer: Self-pay | Admitting: Neurology

## 2023-11-24 NOTE — Telephone Encounter (Signed)
I reviewed ER note from yesterday at [facility].  Concern of right facial numbness while driving in the car.  In the ER suspected allergic reaction complaining of facial numbness and swelling to the right side of her face and ear.  Very subtle right nasolabial fold asymmetry when speaking.  CT scan of the brain was unremarkable.  Allergic reaction versus early Bell's palsy.  Given 5-day dose of prednisone. I would ask her to follow up with her primary care doctor this week. Looking at ER note, seems symptoms were mild. Valtrex was not given, I suppose since symptoms were felt to be mild.

## 2023-11-24 NOTE — Telephone Encounter (Signed)
Call to patient, she agrees to follow up with PCP. She also reports today her upper lip is still tingly and teeth are sensitive. The swelling has gone down and back to baseline. She does report increased bilirubin in lab work from last night compared to a few weeks ago. She reports headache with vision loss a few weeks ago and took a nurtec and laid down and when she got up she back to baseline. She reports having a headache before the episode that night but didn't take anything. Advised I would send Megan Castillo updates symptoms but to still follow up with PCP. Patient verbalized understanding

## 2023-11-24 NOTE — Telephone Encounter (Signed)
Start with seeing PCP, finish course of prednisone. If symptoms consistent with migraine, continue with migraine treatment. Her CT head yesterday was normal.

## 2023-11-26 DIAGNOSIS — G510 Bell's palsy: Secondary | ICD-10-CM | POA: Diagnosis not present

## 2023-11-26 DIAGNOSIS — F5101 Primary insomnia: Secondary | ICD-10-CM | POA: Diagnosis not present

## 2024-01-04 DIAGNOSIS — Z6824 Body mass index (BMI) 24.0-24.9, adult: Secondary | ICD-10-CM | POA: Diagnosis not present

## 2024-01-04 DIAGNOSIS — Z01419 Encounter for gynecological examination (general) (routine) without abnormal findings: Secondary | ICD-10-CM | POA: Diagnosis not present

## 2024-01-04 DIAGNOSIS — Z13820 Encounter for screening for osteoporosis: Secondary | ICD-10-CM | POA: Diagnosis not present

## 2024-01-20 ENCOUNTER — Other Ambulatory Visit: Payer: Self-pay | Admitting: Cardiovascular Disease

## 2024-02-05 DIAGNOSIS — Z1231 Encounter for screening mammogram for malignant neoplasm of breast: Secondary | ICD-10-CM | POA: Diagnosis not present

## 2024-02-07 ENCOUNTER — Ambulatory Visit: Payer: Self-pay

## 2024-02-09 DIAGNOSIS — X32XXXS Exposure to sunlight, sequela: Secondary | ICD-10-CM | POA: Diagnosis not present

## 2024-02-09 DIAGNOSIS — L918 Other hypertrophic disorders of the skin: Secondary | ICD-10-CM | POA: Diagnosis not present

## 2024-02-09 DIAGNOSIS — L814 Other melanin hyperpigmentation: Secondary | ICD-10-CM | POA: Diagnosis not present

## 2024-02-09 DIAGNOSIS — D2230 Melanocytic nevi of unspecified part of face: Secondary | ICD-10-CM | POA: Diagnosis not present

## 2024-02-11 ENCOUNTER — Encounter: Payer: Self-pay | Admitting: Cardiovascular Disease

## 2024-02-11 DIAGNOSIS — E7800 Pure hypercholesterolemia, unspecified: Secondary | ICD-10-CM

## 2024-02-16 DIAGNOSIS — E7800 Pure hypercholesterolemia, unspecified: Secondary | ICD-10-CM | POA: Diagnosis not present

## 2024-02-16 LAB — LIPID PANEL
Chol/HDL Ratio: 3.7 ratio (ref 0.0–4.4)
Cholesterol, Total: 238 mg/dL — ABNORMAL HIGH (ref 100–199)
HDL: 64 mg/dL (ref >39)
LDL Chol Calc (NIH): 159 mg/dL — ABNORMAL HIGH (ref 0–99)
Triglycerides: 86 mg/dL (ref 0–149)
VLDL Cholesterol Cal: 15 mg/dL (ref 5–40)

## 2024-02-17 ENCOUNTER — Encounter: Payer: Self-pay | Admitting: Cardiovascular Disease

## 2024-02-18 DIAGNOSIS — H018 Other specified inflammations of eyelid: Secondary | ICD-10-CM | POA: Diagnosis not present

## 2024-02-18 DIAGNOSIS — H04123 Dry eye syndrome of bilateral lacrimal glands: Secondary | ICD-10-CM | POA: Diagnosis not present

## 2024-02-19 ENCOUNTER — Encounter: Payer: Self-pay | Admitting: Cardiovascular Disease

## 2024-02-19 ENCOUNTER — Ambulatory Visit: Payer: BC Managed Care – PPO | Attending: Cardiovascular Disease | Admitting: Cardiovascular Disease

## 2024-02-19 VITALS — BP 110/80 | HR 78 | Ht 63.0 in | Wt 142.8 lb

## 2024-02-19 DIAGNOSIS — E7800 Pure hypercholesterolemia, unspecified: Secondary | ICD-10-CM

## 2024-02-19 DIAGNOSIS — I10 Essential (primary) hypertension: Secondary | ICD-10-CM | POA: Diagnosis not present

## 2024-02-19 NOTE — Patient Instructions (Signed)
 Medication Instructions:   Your physician recommends that you continue on your current medications as directed. Please refer to the Current Medication list given to you today.   *If you need a refill on your cardiac medications before your next appointment, please call your pharmacy*   Lab Work: NONE    If you have labs (blood work) drawn today and your tests are completely normal, you will receive your results only by: MyChart Message (if you have MyChart) OR A paper copy in the mail If you have any lab test that is abnormal or we need to change your treatment, we will call you to review the results.   Testing/Procedures: NONE    Follow-Up: At [facility], you and your health needs are our priority.  As part of our continuing mission to provide you with exceptional heart care, we have created designated Provider Care Teams.  These Care Teams include your primary Cardiologist (physician) and Advanced Practice Providers (APPs -  Physician Assistants and Nurse Practitioners) who all work together to provide you with the care you need, when you need it.  We recommend signing up for the patient portal called "MyChart".  Sign up information is provided on this After Visit Summary.  MyChart is used to connect with patients for Virtual Visits (Telemedicine).  Patients are able to view lab/test results, encounter notes, upcoming appointments, etc.  Non-urgent messages can be sent to your provider as well.   To learn more about what you can do with MyChart, go to [email or web address].    Your next appointment:   1 year(s)  The format for your next appointment:   In Person  Provider:   [facility]    Other Instructions

## 2024-02-19 NOTE — Progress Notes (Signed)
 Cardiology Office Note:    Date:  02/19/2024   ID:  SESILIA POUCHER, DOB April 05, 1966, MRN [id number]  PCP:  Georgianne Fick, MD   [facility] Providers Cardiologist:  None     Referring MD: Georgianne Fick, MD   Chief Complaint  Patient presents with   Tachycardia    Pt says sometimes at night she feels her heart racing believes it's too much caffeine     History of Present Illness:    Megan Castillo is a 58 y.o. female with a hx of migraine headaches, treated hypothyroidism, mild dyslipidemia and recent development of hypertension.  Workup for secondary causes of hypertension was negative.  After starting a low-dose of amlodipine her blood pressure has normalized and she feels better.  Due to some complaints of atypical chest pain we also performed a coronary CT angiogram.  There were no coronary stenoses or abnormalities of the thoracic aorta.  The coronary calcium score was 0.  She has fairly significant elevation in LDL cholesterol around 160, but because of the low coronary calcium score we have decided not to continue lipid-lowering therapy.  The patient specifically denies any chest pain at rest or with exertion, dyspnea at rest or with exertion, orthopnea, paroxysmal nocturnal dyspnea, syncope, palpitations, focal neurological deficits, intermittent claudication, lower extremity edema, unexplained weight gain, cough, hemoptysis or wheezing.   Past Medical History:  Diagnosis Date   Arthritis    Constipation    Hemorrhoids    Migraine    Rectal pain     Past Surgical History:  Procedure Laterality Date   ABDOMINAL HYSTERECTOMY  11/2009   BREAST REDUCTION SURGERY Bilateral 04/25/2021   Procedure: BREAST REDUCTION WITH LIPOSUCTION;  Surgeon: Peggye Form, DO;  Location: [facility];  Service: Plastics;  Laterality: Bilateral;  3 hours   HEMORRHOID SURGERY     NASAL RECONSTRUCTION      Current Medications: Current Meds   Medication Sig   amLODipine (NORVASC) 2.5 MG tablet TAKE 1 TABLET BY MOUTH EVERY DAY   cholecalciferol (VITAMIN D3) 25 MCG (1000 UNIT) tablet Take 1,000 Units by mouth daily.   cyanocobalamin (VITAMIN B12) 1000 MCG tablet Take 1,000 mcg by mouth daily.   EYSUVIS 0.25 % SUSP Place 1 drop into both eyes 2 (two) times daily. Use four times daily for two weeks   NP THYROID 30 MG tablet Take 30 mg by mouth daily.   Probiotic Product (ALIGN PO) Take 1 capsule by mouth daily.   RESTASIS 0.05 % ophthalmic emulsion Place 1 drop into both eyes 2 (two) times daily.   Rimegepant Sulfate (NURTEC) 75 MG TBDP Take 1 tablet (75 mg total) by mouth daily as needed.   rizatriptan (MAXALT) 5 MG tablet Take 5 mg by mouth as needed for migraine. May repeat in 2 hours if needed   rosuvastatin (CRESTOR) 10 MG tablet Take 10 mg by mouth daily.   Semaglutide, 1 MG/DOSE, (OZEMPIC, 1 MG/DOSE,) 4 MG/3ML SOPN Inject 1 mg into the skin once a week. Saturday   tretinoin (RETIN-A) 0.025 % cream Apply 1 Application topically at bedtime as needed (dry skin).     Allergies:   Bee pollen   Social History   Socioeconomic History   Marital status: Married    Spouse name: Loraine Leriche   Number of children: 2   Years of education: 13 1/2   Highest education level: Not on file  Occupational History    Comment: Scientist, water quality  Tobacco Use  Smoking status: Never   Smokeless tobacco: Never  Substance and Sexual Activity   Alcohol use: No   Drug use: No   Sexual activity: Yes  Other Topics Concern   Not on file  Social History Narrative   ** Merged History Encounter **       Lives at home with husband Caffeine use-tea twice a day   Social Drivers of Corporate investment banker Strain: Not on file  Food Insecurity: Not on file  Transportation Needs: Not on file  Physical Activity: Not on file  Stress: Not on file  Social Connections: Not on file     Family History: The patient's family history includes Cancer  (age of onset: 60) in her brother; Heart disease in her mother.  ROS:   Please see the history of present illness.     All other systems reviewed and are negative.  EKGs/Labs/Other Studies Reviewed:    The following studies were reviewed today: CT of the abdomen-pelvis 10/01/2021 and MRI of the abdomen 10/26/2021 does not show aortic atherosclerosis or aortic aneurysm.  EKG:    EKG Interpretation Date/Time:    Ventricular Rate:    PR Interval:    QRS Duration:    QT Interval:    QTC Calculation:   R Axis:      Text Interpretation:           Recent Labs: No results found for requested labs within last 365 days.  Recent Lipid Panel    Component Value Date/Time   CHOL 238 (H) 02/16/2024 0938   TRIG 86 02/16/2024 0938   HDL 64 02/16/2024 0938   CHOLHDL 3.7 02/16/2024 0938   LDLCALC 159 (H) 02/16/2024 0938   09/08/2022 Cholesterol 173, HDL 52, LDL 104, triglycerides 84 LDL particle number 1796, small LDL 790.    Risk Assessment/Calculations:       Physical Exam:    VS:  BP 110/80 (BP Location: Left Arm, Patient Position: Sitting, Cuff Size: Normal)   Pulse 78   Ht 5\' 3"  (1.6 m)   Wt 142 lb 12.8 oz (64.8 kg)   SpO2 98%   BMI 25.30 kg/m     Wt Readings from Last 3 Encounters:  02/19/24 142 lb 12.8 oz (64.8 kg)  10/14/23 136 lb 9.6 oz (62 kg)  01/12/23 135 lb (61.2 kg)      General: Alert, oriented x3, no distress, appears lean and fit Head: no evidence of trauma, PERRL, EOMI, no exophtalmos or lid lag, no myxedema, no xanthelasma; normal ears, nose and oropharynx Neck: normal jugular venous pulsations and no hepatojugular reflux; brisk carotid pulses without delay and no carotid bruits Chest: clear to auscultation, no signs of consolidation by percussion or palpation, normal fremitus, symmetrical and full respiratory excursions Cardiovascular: normal position and quality of the apical impulse, regular rhythm, normal first and second heart sounds, no  murmurs, rubs or gallops Abdomen: no tenderness or distention, no masses by palpation, no abnormal pulsatility or arterial bruits, normal bowel sounds, no hepatosplenomegaly Extremities: no clubbing, cyanosis or edema; 2+ radial, ulnar and brachial pulses bilaterally; 2+ right femoral, posterior tibial and dorsalis pedis pulses; 2+ left femoral, posterior tibial and dorsalis pedis pulses; no subclavian or femoral bruits Neurological: grossly nonfocal Psych: Normal mood and affect    ASSESSMENT:    No diagnosis found.   PLAN:    In order of problems listed above:  Precordial pain: Atypical.  Normal CT angiogram with coronary calcium score of 0 HTN:  Well-controlled.  Workup for secondary causes of hypertension was negative. HLP: Coronary calcium score of 0 so currently not on lipid-lowering therapy, but should monitor for development of symptoms and consider repeating a coronary calcium score in a few years.  Discussed improvements in diet and exercise regimen to help bring down her LDL cholesterol improve her cardiovascular risk without medications.           Medication Adjustments/Labs and Tests Ordered: Current medicines are reviewed at length with the patient today.  Concerns regarding medicines are outlined above.  No orders of the defined types were placed in this encounter.  No orders of the defined types were placed in this encounter.   There are no Patient Instructions on file for this visit.   Signed, Thurmon Fair, MD  02/19/2024 8:36 AM    [facility]

## 2024-03-04 DIAGNOSIS — R5382 Chronic fatigue, unspecified: Secondary | ICD-10-CM | POA: Diagnosis not present

## 2024-03-04 DIAGNOSIS — E039 Hypothyroidism, unspecified: Secondary | ICD-10-CM | POA: Diagnosis not present

## 2024-03-04 DIAGNOSIS — R799 Abnormal finding of blood chemistry, unspecified: Secondary | ICD-10-CM | POA: Diagnosis not present

## 2024-03-22 DIAGNOSIS — L814 Other melanin hyperpigmentation: Secondary | ICD-10-CM | POA: Diagnosis not present

## 2024-03-22 DIAGNOSIS — D2239 Melanocytic nevi of other parts of face: Secondary | ICD-10-CM | POA: Diagnosis not present

## 2024-03-22 DIAGNOSIS — D2339 Other benign neoplasm of skin of other parts of face: Secondary | ICD-10-CM | POA: Diagnosis not present

## 2024-04-12 DIAGNOSIS — G629 Polyneuropathy, unspecified: Secondary | ICD-10-CM | POA: Diagnosis not present

## 2024-04-16 ENCOUNTER — Other Ambulatory Visit: Payer: Self-pay | Admitting: Cardiovascular Disease

## 2024-04-29 DIAGNOSIS — M5416 Radiculopathy, lumbar region: Secondary | ICD-10-CM | POA: Diagnosis not present

## 2024-04-29 DIAGNOSIS — M47816 Spondylosis without myelopathy or radiculopathy, lumbar region: Secondary | ICD-10-CM | POA: Diagnosis not present

## 2024-04-29 DIAGNOSIS — Z1331 Encounter for screening for depression: Secondary | ICD-10-CM | POA: Diagnosis not present

## 2024-05-30 DIAGNOSIS — M542 Cervicalgia: Secondary | ICD-10-CM | POA: Diagnosis not present

## 2024-06-23 DIAGNOSIS — M5416 Radiculopathy, lumbar region: Secondary | ICD-10-CM | POA: Diagnosis not present

## 2024-06-29 DIAGNOSIS — H00025 Hordeolum internum left lower eyelid: Secondary | ICD-10-CM | POA: Diagnosis not present

## 2024-06-29 DIAGNOSIS — H02883 Meibomian gland dysfunction of right eye, unspecified eyelid: Secondary | ICD-10-CM | POA: Diagnosis not present

## 2024-06-29 DIAGNOSIS — H16223 Keratoconjunctivitis sicca, not specified as Sjogren's, bilateral: Secondary | ICD-10-CM | POA: Diagnosis not present

## 2024-06-29 DIAGNOSIS — H04123 Dry eye syndrome of bilateral lacrimal glands: Secondary | ICD-10-CM | POA: Diagnosis not present

## 2024-07-04 DIAGNOSIS — M545 Low back pain, unspecified: Secondary | ICD-10-CM | POA: Diagnosis not present

## 2024-07-04 DIAGNOSIS — M542 Cervicalgia: Secondary | ICD-10-CM | POA: Diagnosis not present

## 2024-07-04 DIAGNOSIS — G629 Polyneuropathy, unspecified: Secondary | ICD-10-CM | POA: Diagnosis not present

## 2024-07-12 DIAGNOSIS — M47816 Spondylosis without myelopathy or radiculopathy, lumbar region: Secondary | ICD-10-CM | POA: Diagnosis not present

## 2024-07-15 ENCOUNTER — Ambulatory Visit: Admitting: Podiatry

## 2024-07-15 ENCOUNTER — Ambulatory Visit (INDEPENDENT_AMBULATORY_CARE_PROVIDER_SITE_OTHER): Admitting: Podiatry

## 2024-07-15 DIAGNOSIS — Z91199 Patient's noncompliance with other medical treatment and regimen due to unspecified reason: Secondary | ICD-10-CM

## 2024-07-15 NOTE — Progress Notes (Signed)
 Cancel 24 hours

## 2024-07-18 ENCOUNTER — Ambulatory Visit: Admitting: Podiatry

## 2024-07-20 ENCOUNTER — Ambulatory Visit

## 2024-07-20 ENCOUNTER — Encounter: Payer: Self-pay | Admitting: Podiatry

## 2024-07-20 ENCOUNTER — Ambulatory Visit (INDEPENDENT_AMBULATORY_CARE_PROVIDER_SITE_OTHER): Admitting: Podiatry

## 2024-07-20 DIAGNOSIS — M7751 Other enthesopathy of right foot: Secondary | ICD-10-CM

## 2024-07-20 DIAGNOSIS — M2041 Other hammer toe(s) (acquired), right foot: Secondary | ICD-10-CM

## 2024-07-21 DIAGNOSIS — M545 Low back pain, unspecified: Secondary | ICD-10-CM | POA: Diagnosis not present

## 2024-07-21 DIAGNOSIS — M542 Cervicalgia: Secondary | ICD-10-CM | POA: Diagnosis not present

## 2024-08-01 DIAGNOSIS — R748 Abnormal levels of other serum enzymes: Secondary | ICD-10-CM | POA: Diagnosis not present

## 2024-08-01 DIAGNOSIS — R202 Paresthesia of skin: Secondary | ICD-10-CM | POA: Diagnosis not present

## 2024-08-01 DIAGNOSIS — R5383 Other fatigue: Secondary | ICD-10-CM | POA: Diagnosis not present

## 2024-08-02 DIAGNOSIS — S0500XD Injury of conjunctiva and corneal abrasion without foreign body, unspecified eye, subsequent encounter: Secondary | ICD-10-CM | POA: Diagnosis not present

## 2024-08-02 DIAGNOSIS — H04123 Dry eye syndrome of bilateral lacrimal glands: Secondary | ICD-10-CM | POA: Diagnosis not present

## 2024-08-02 DIAGNOSIS — H02402 Unspecified ptosis of left eyelid: Secondary | ICD-10-CM | POA: Diagnosis not present

## 2024-08-04 DIAGNOSIS — H02883 Meibomian gland dysfunction of right eye, unspecified eyelid: Secondary | ICD-10-CM | POA: Diagnosis not present

## 2024-08-04 DIAGNOSIS — M5417 Radiculopathy, lumbosacral region: Secondary | ICD-10-CM | POA: Diagnosis not present

## 2024-08-04 DIAGNOSIS — H16223 Keratoconjunctivitis sicca, not specified as Sjogren's, bilateral: Secondary | ICD-10-CM | POA: Diagnosis not present

## 2024-08-04 DIAGNOSIS — H04123 Dry eye syndrome of bilateral lacrimal glands: Secondary | ICD-10-CM | POA: Diagnosis not present

## 2024-08-04 DIAGNOSIS — H02886 Meibomian gland dysfunction of left eye, unspecified eyelid: Secondary | ICD-10-CM | POA: Diagnosis not present

## 2024-08-04 DIAGNOSIS — H04129 Dry eye syndrome of unspecified lacrimal gland: Secondary | ICD-10-CM | POA: Diagnosis not present

## 2024-08-04 NOTE — Progress Notes (Signed)
   Chief Complaint  Patient presents with   Toe Pain    Pt is here for R 2nd, 3rd, 4th metatarsal plantar pain. Level 5.  Also having numbness in R 2nd, 3rd, 4th toes.  Started 2 weeks ago.  Bought house shoes to wear instead of barefoot.  Non Diabetic.  No anti coags     HPI: 58 y.o. female presenting today for above complaint  Past Medical History:  Diagnosis Date   Arthritis    Constipation    Hemorrhoids    Migraine    Rectal pain     Past Surgical History:  Procedure Laterality Date   ABDOMINAL HYSTERECTOMY  11/2009   BREAST REDUCTION SURGERY Bilateral 04/25/2021   Procedure: BREAST REDUCTION WITH LIPOSUCTION;  Surgeon: Lowery Estefana RAMAN, DO;  Location: [facility];  Service: Plastics;  Laterality: Bilateral;  3 hours   HEMORRHOID SURGERY     NASAL RECONSTRUCTION      Allergies  Allergen Reactions   Bee Pollen Rash     Physical Exam: General: The patient is alert and oriented x3 in no acute distress.  Dermatology: Skin is warm, dry and supple bilateral lower extremities.   Vascular: Palpable pedal pulses bilaterally. Capillary refill within normal limits.  No appreciable edema.  No erythema.  Neurological: Grossly intact via light touch  Musculoskeletal Exam: Reducible hammertoe contracture deformity noted to the second digit right foot with associated tenderness with palpation and range of motion of the second DP.  Radiographic Exam RT foot 07/20/2024:  Normal osseous mineralization. Joint spaces preserved.  No fractures or osseous irregularities noted.  Assessment/Plan of Care: 1.  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  Metatarsalgia right foot  -Patient evaluated.  X-rays reviewed -Discussed the pathology and etiology of hammertoe contracture and the retrograde pressure of this applied to the second MTP.  This is where the patient is having the majority of her pain. -Advised against going barefoot.  Recommend arch  supports to offload pressure from the forefoot and support the medial longitudinal arch - declined cortisone injection -Declined anti-inflammatory medication -Return to clinic PRN       Thresa EMERSON Sar, DPM [facility]  Dr. Thresa EMERSON Sar, DPM    [address] [place], [place] [address]                Office [phone]  Fax [phone]

## 2024-08-13 ENCOUNTER — Inpatient Hospital Stay
Admission: RE | Admit: 2024-08-13 | Discharge: 2024-08-13 | Disposition: A | Discharge status: Home / Self Care | Attending: Internal Medicine

## 2024-08-13 NOTE — ED Notes (Signed)
 Megan Castillo at patient access came back and informed us  that the pt left. We pulled an emergency back before Megan Castillo with an appointment (9:00am). She was not happy with this and voiced her complaints to Megan Castillo. Megan Castillo informed the pt that we have to pull emergencies back first even over appointments but that she would be the next pt pulled back. Megan Castillo was not satisfied with Megan Castillo's response because she decided that the pt we pulled back was not an emergency and should not have been pulled back first. Because of this the pt decided to walk out at 9:04am.

## 2024-09-06 ENCOUNTER — Other Ambulatory Visit: Payer: Self-pay

## 2024-09-07 ENCOUNTER — Institutional Professional Consult (permissible substitution): Admitting: Diagnostic Neuroimaging

## 2024-09-28 ENCOUNTER — Other Ambulatory Visit: Payer: Self-pay | Admitting: Student

## 2024-09-28 DIAGNOSIS — R109 Unspecified abdominal pain: Secondary | ICD-10-CM | POA: Diagnosis not present

## 2024-09-28 DIAGNOSIS — K219 Gastro-esophageal reflux disease without esophagitis: Secondary | ICD-10-CM | POA: Diagnosis not present

## 2024-10-05 ENCOUNTER — Ambulatory Visit
Admission: RE | Admit: 2024-10-05 | Discharge: 2024-10-05 | Disposition: A | Source: Ambulatory Visit | Attending: Student

## 2024-10-05 DIAGNOSIS — R109 Unspecified abdominal pain: Secondary | ICD-10-CM | POA: Diagnosis not present

## 2024-10-12 ENCOUNTER — Telehealth: Payer: BC Managed Care – PPO | Admitting: Neurology

## 2024-10-12 DIAGNOSIS — H02402 Unspecified ptosis of left eyelid: Secondary | ICD-10-CM | POA: Diagnosis not present

## 2024-10-12 DIAGNOSIS — G43709 Chronic migraine without aura, not intractable, without status migrainosus: Secondary | ICD-10-CM | POA: Diagnosis not present

## 2024-10-12 DIAGNOSIS — G43009 Migraine without aura, not intractable, without status migrainosus: Secondary | ICD-10-CM

## 2024-10-12 MED ORDER — NURTEC 75 MG PO TBDP: 75.0000 mg | ORAL_TABLET | Freq: Every day | ORAL | 11 refills | Status: AC | PRN

## 2024-10-12 NOTE — Patient Instructions (Signed)
 Great to see you today! Continue Nurtec as needed Reach out for worsening headaches Discussed having your primary care refill Nurtec going forward, otherwise follow-up here in 1 year virtually.  Thanks!!

## 2024-10-12 NOTE — Progress Notes (Signed)
 Patient: Megan Castillo Date of Birth: Jan 31, 1966  Reason for Visit: Follow up History from: Patient Primary Neurologist: [facility]   Virtual Visit via Video Note  I connected with [name] on 10/12/24 at  2:30 PM EST by a video enabled telemedicine application and verified that I am speaking with the correct person using two identifiers.  Location: Patient: at her home Provider: in the office    I discussed the limitations of evaluation and management by telemedicine and the availability of in person appointments. The patient expressed understanding and agreed to proceed.  ASSESSMENT AND PLAN 58 y.o. year old female   Chronic migraine headache  - Doing excellent - Continue Nurtec 75 mg tablet as needed for acute migraine - Previously tried and failed: Imitrex  - Discussed okay to follow-up with primary care for medication refill, will go ahead and schedule for 1 year virtually - Next steps: If migraines increase consider propranolol or amitriptyline if daily preventative is needed  2.  Chronic left ptosis -Improved after eyelid lift -Continues to see ophthalmology for chronic dry eye  HISTORY OF PRESENT ILLNESS: Today 10/12/24 Update 10/12/24 SS: VV, doing excellent with migraines, about 2-3 a month. Takes Nurtec PRN, Nurtec works well for migraines. Will take at onset of aura. Has a new referral to see Dr. [name] in Feb for neuropathy concern.  Update 10/14/23 SS: Some trouble with dry eyes, has seen specialist at [facility]. Uses Nurtec PRN, migraine frequency varies, could be none up to 2-3 times a month. 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   HISTORY  UPDATE (10/21/22, VRP): Since last visit, doing fair; 2 HA per month; sumatriptan  helps, but now takes up 3-5 hours to help. Usually goes to sleep.    UPDATE (05/07/21, VRP): Since last visit, doing well. Symptoms are stable. HA 2-3 per month. Tolerating sumatriptan  50mg  as needed (breaks 100mg  capsule in half). Left  ptosis stable. Saw ophthalmology and no specific cause found.   UPDATE (10/10/20, VRP): Since last visit, doing well. Symptoms are stable. 2 HA per month. Left ptosis (noted in 2018 ID photo) slightly more noticeable. No alleviating or aggravating factors.   UPDATE (10/10/19, VRP): Since last visit, now with HA every 4-6 weeks. Sumatriptan  helps, but takes a few hours to help. No alleviating or aggravating factors. Tolerating sumatriptan .     UPDATE (10/04/18, VRP): Since last visit, doing well. Symptoms are mild. Severity is mild. No alleviating or aggravating factors. Tolerating sumatriptan  50.  Avg 1 HA every 6 months.   UPDATE (12/02/17, VRP): Since last visit, doing well for several months; 1 HA every 2-3 months. Then in Oct 2018 had 1 HA per week. Tolerating sumatriptan  helps. No new alleviating or aggravating factors.    UPDATE 08/05/16: Since last visit, doing well with headaches (1 every month or other month). Sumatriptan  50mg  daily well. Still with high levels of stress (husband just d/c'd from hospital today for MI).    PRIOR HPI (03/03/16): 57 year old female here for evaluation of headaches. For past 3-4 years patient has had onset of unilateral, sometimes bilateral, periorbital severe intense headache and pain. Patient typically lays down, turns down the lights, uses warm washcloth to help relieve symptoms. Patient feels tired and foggy after headaches. Sometimes she has decreased peripheral vision and scotoma during the headaches. Patient has some sensitivity to light. No nausea or vomiting. She has some dizziness. She has some sinus pressure. Patient thought she had sinus headaches for many years and went to  ENT recently for evaluation. Patient was told she may have migraine headaches and referred to me for further evaluation. Patient having approximately 2-4 days of migraine headache per month. Triggering factors include change of weather, change of season, decreased sleep. Patient tried  sumatriptan  in the past with mild relief.  REVIEW OF SYSTEMS: Out of a complete 14 system review of symptoms, the patient complains only of the following symptoms, and all other reviewed systems are negative.  See HPI  ALLERGIES: Allergies  Allergen Reactions   Bee Pollen Rash    HOME MEDICATIONS: Outpatient Medications Prior to Visit  Medication Sig Dispense Refill   amLODipine  (NORVASC ) 2.5 MG tablet TAKE 1 TABLET BY MOUTH EVERY DAY 90 tablet 3   cholecalciferol (VITAMIN D3) 25 MCG (1000 UNIT) tablet Take 1,000 Units by mouth daily.     cyanocobalamin  (VITAMIN B12) 1000 MCG tablet Take 1,000 mcg by mouth daily.     Probiotic Product (ALIGN PO) Take 1 capsule by mouth daily.     Rimegepant Sulfate (NURTEC) 75 MG TBDP Take 1 tablet (75 mg total) by mouth daily as needed. 8 tablet 11   tretinoin (RETIN-A) 0.025 % cream Apply 1 Application topically at bedtime as needed (dry skin).     No facility-administered medications prior to visit.    PAST MEDICAL HISTORY: Past Medical History:  Diagnosis Date   Arthritis    Constipation    Hemorrhoids    Migraine    Rectal pain     PAST SURGICAL HISTORY: Past Surgical History:  Procedure Laterality Date   ABDOMINAL HYSTERECTOMY  11/2009   BREAST REDUCTION SURGERY Bilateral 04/25/2021   Procedure: BREAST REDUCTION WITH LIPOSUCTION;  Surgeon: Lowery Estefana RAMAN, DO;  Location: [facility];  Service: Plastics;  Laterality: Bilateral;  3 hours   HEMORRHOID SURGERY     NASAL RECONSTRUCTION      FAMILY HISTORY: Family History  Problem Relation Age of Onset   Heart disease Mother    Cancer Brother 10       Burkett lymphoma    SOCIAL HISTORY: Social History   Socioeconomic History   Marital status: Married    Spouse name: Merchant Navy Officer   Number of children: 2   Years of education: 13 1/2   Highest education level: Not on file  Occupational History    Comment: Scientist, Water [employer]  Tobacco Use   Smoking status: Never    Smokeless tobacco: Never  Substance and Sexual Activity   Alcohol use: No   Drug use: No   Sexual activity: Yes  Other Topics Concern   Not on file  Social History Narrative   ** Merged History Encounter **       Lives at home with husband Caffeine use-tea twice a day   Social Drivers of Health   Financial Resource Strain: Low Risk  (04/29/2024)   Received from [facility]   Overall Financial Resource Strain (CARDIA)    Difficulty of Paying Living Expenses: Not hard at all  Food Insecurity: No Food Insecurity (04/29/2024)   Received from [facility]   Hunger Vital Sign    Within the past 12 months, you worried that your food would run out before you got the money to buy more.: Never true    Within the past 12 months, the food you bought just didn't last and you didn't have money to get more.: Never true  Transportation Needs: No Transportation Needs (04/29/2024)   Received from [facility]  Administrator, Civil Service (Medical): No    Lack of Transportation (Non-Medical): No  Physical Activity: Not on file  Stress: Not on file  Social Connections: Not on file  Intimate Partner Violence: Not At Risk (11/23/2023)   Received from [facility]   HITS    Over the last 12 months how often did your partner physically hurt you?: Never    Over the last 12 months how often did your partner insult you or talk down to you?: Never    Over the last 12 months how often did your partner threaten you with physical harm?: Never    Over the last 12 months how often did your partner scream or curse at you?: Never    PHYSICAL EXAM  There were no vitals filed for this visit.  There is no height or weight on file to calculate BMI.  Virtual visit  DIAGNOSTIC DATA (LABS, IMAGING, TESTING) - I reviewed patient records, labs, notes, testing and imaging myself where available.  Lab Results  Component Value Date   WBC 8.0 11/01/2022   HGB 14.4 11/01/2022    HCT 44.5 11/01/2022   MCV 92.5 11/01/2022   PLT 238 11/01/2022      Component Value Date/Time   NA 141 11/01/2022 1954   K 3.2 (L) 11/01/2022 1954   CL 105 11/01/2022 1954   CO2 25 11/01/2022 1954   GLUCOSE 103 (H) 11/01/2022 1954   BUN 9 11/01/2022 1954   CREATININE 0.61 11/01/2022 1954   CREATININE 0.70 04/10/2013 1702   CALCIUM 9.0 11/01/2022 1954   PROT 7.1 11/02/2022 0841   ALBUMIN 4.2 11/02/2022 0841   AST 20 11/02/2022 0841   ALT 16 11/02/2022 0841   ALKPHOS 91 11/02/2022 0841   BILITOT 0.5 11/02/2022 0841   GFRNONAA >60 11/01/2022 1954   GFRAA >60 11/01/2018 1858   Lab Results  Component Value Date   CHOL 238 (H) 02/16/2024   HDL 64 02/16/2024   LDLCALC 159 (H) 02/16/2024   TRIG 86 02/16/2024   CHOLHDL 3.7 02/16/2024   No results found for: HGBA1C No results found for: [id number] Lab Results  Component Value Date   TSH 3.320 10/10/2020    Lauraine Born, AGNP-C, DNP 10/12/2024, 5:39 AM [facility] [address] [place], [place] [address] [phone]

## 2024-11-09 DIAGNOSIS — E039 Hypothyroidism, unspecified: Secondary | ICD-10-CM | POA: Diagnosis not present

## 2024-11-09 DIAGNOSIS — E0590 Thyrotoxicosis, unspecified without thyrotoxic crisis or storm: Secondary | ICD-10-CM | POA: Diagnosis not present

## 2024-11-09 DIAGNOSIS — R634 Abnormal weight loss: Secondary | ICD-10-CM | POA: Diagnosis not present

## 2024-11-09 DIAGNOSIS — R799 Abnormal finding of blood chemistry, unspecified: Secondary | ICD-10-CM | POA: Diagnosis not present

## 2024-11-09 DIAGNOSIS — Z0000 Encounter for general adult medical examination without abnormal findings: Secondary | ICD-10-CM | POA: Diagnosis not present

## 2024-11-09 DIAGNOSIS — E782 Mixed hyperlipidemia: Secondary | ICD-10-CM | POA: Diagnosis not present

## 2024-11-09 DIAGNOSIS — R5382 Chronic fatigue, unspecified: Secondary | ICD-10-CM | POA: Diagnosis not present

## 2024-11-21 DIAGNOSIS — E782 Mixed hyperlipidemia: Secondary | ICD-10-CM | POA: Diagnosis not present

## 2024-11-21 DIAGNOSIS — E0590 Thyrotoxicosis, unspecified without thyrotoxic crisis or storm: Secondary | ICD-10-CM | POA: Diagnosis not present

## 2024-11-21 DIAGNOSIS — Z0000 Encounter for general adult medical examination without abnormal findings: Secondary | ICD-10-CM | POA: Diagnosis not present

## 2024-12-22 ENCOUNTER — Encounter: Payer: Self-pay | Admitting: Cardiovascular Disease

## 2024-12-22 DIAGNOSIS — E7800 Pure hypercholesterolemia, unspecified: Secondary | ICD-10-CM

## 2024-12-26 NOTE — Telephone Encounter (Signed)
 Unless already done in last 6 months, BMET and lipid profile please

## 2025-01-25 ENCOUNTER — Institutional Professional Consult (permissible substitution): Admitting: Diagnostic Neuroimaging

## 2025-02-20 ENCOUNTER — Ambulatory Visit: Admitting: Cardiovascular Disease

## 2025-10-18 ENCOUNTER — Telehealth: Admitting: Neurology
